# Patient Record
Sex: Female | Born: 1985
Health system: Southern US, Community
[De-identification: ages and names within clinical notes are randomized; demographics above are authoritative.]

## PROBLEM LIST (undated history)

## (undated) DIAGNOSIS — F909 Attention-deficit hyperactivity disorder, unspecified type: Secondary | ICD-10-CM

## (undated) DIAGNOSIS — O24419 Gestational diabetes mellitus in pregnancy, unspecified control: Secondary | ICD-10-CM

## (undated) DIAGNOSIS — Z789 Other specified health status: Secondary | ICD-10-CM

## (undated) DIAGNOSIS — Z8632 Personal history of gestational diabetes: Secondary | ICD-10-CM

## (undated) HISTORY — DX: Gestational diabetes mellitus in pregnancy, unspecified control: O24.419

## (undated) HISTORY — DX: Attention-deficit hyperactivity disorder, unspecified type: F90.9

## (undated) HISTORY — PX: NO PAST SURGERIES: SHX2092

## (undated) HISTORY — DX: Personal history of gestational diabetes: Z86.32

---
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
## 2018-02-01 DIAGNOSIS — F900 Attention-deficit hyperactivity disorder, predominantly inattentive type: Secondary | ICD-10-CM | POA: Diagnosis not present

## 2018-03-01 DIAGNOSIS — F900 Attention-deficit hyperactivity disorder, predominantly inattentive type: Secondary | ICD-10-CM | POA: Diagnosis not present

## 2018-03-09 MED FILL — DEXTROAMP-AMPHETAMIN 20 MG: 20 | 30 days supply | Qty: 30 | Fill #0

## 2018-03-31 DIAGNOSIS — F900 Attention-deficit hyperactivity disorder, predominantly inattentive type: Secondary | ICD-10-CM | POA: Diagnosis not present

## 2018-04-06 MED FILL — DEXTROAMP-AMPHETAMIN 20 MG: 20 | 30 days supply | Qty: 60 | Fill #0

## 2018-05-06 MED FILL — DEXTROAMP-AMPHETAMIN 20 MG: 20 | 30 days supply | Qty: 60 | Fill #0

## 2018-06-04 MED FILL — AMPHETAMINE-DEXTROAMPHETAMI: 20 | 30 days supply | Qty: 60 | Fill #0

## 2018-06-28 DIAGNOSIS — F900 Attention-deficit hyperactivity disorder, predominantly inattentive type: Secondary | ICD-10-CM | POA: Diagnosis not present

## 2018-07-06 MED FILL — AMPHETAMINE-DEXTROAMPHETAMI: 20 | 30 days supply | Qty: 60 | Fill #0

## 2018-08-03 MED FILL — AMPHETAMINE-DEXTROAMPHETAMI: 20 | 30 days supply | Qty: 60 | Fill #0

## 2018-09-01 MED FILL — predniSONE 10 MG TABS: 10 | 12 days supply | Qty: 48 | Fill #0

## 2018-09-01 MED FILL — AMPHETAMINE-DEXTROAMPHETAMI: 20 | 30 days supply | Qty: 60 | Fill #0

## 2018-09-30 MED FILL — AMPHETAMINE-DEXTROAMPHETAMI: 20 | 30 days supply | Qty: 60 | Fill #0

## 2018-10-12 DIAGNOSIS — F900 Attention-deficit hyperactivity disorder, predominantly inattentive type: Secondary | ICD-10-CM | POA: Diagnosis not present

## 2018-11-02 MED FILL — AMPHETAMINE-DEXTROAMPHETAMI: 20 | 30 days supply | Qty: 60 | Fill #0

## 2018-12-13 MED FILL — AMPHETAMINE-DEXTROAMPHETAMI: 20 | 30 days supply | Qty: 60 | Fill #0

## 2019-01-10 DIAGNOSIS — F900 Attention-deficit hyperactivity disorder, predominantly inattentive type: Secondary | ICD-10-CM | POA: Diagnosis not present

## 2019-01-11 MED FILL — ADDERALL XR 30 MG CAP SA: 30 | 30 days supply | Qty: 30 | Fill #0

## 2019-01-11 MED FILL — AMPHETAMINE-DEXTROAMPHETAMI: 20 | 30 days supply | Qty: 30 | Fill #0

## 2019-02-21 DIAGNOSIS — N390 Urinary tract infection, site not specified: Secondary | ICD-10-CM | POA: Diagnosis not present

## 2019-02-21 DIAGNOSIS — Z3201 Encounter for pregnancy test, result positive: Secondary | ICD-10-CM | POA: Diagnosis not present

## 2019-02-22 MED FILL — AMPHETAMINE-DEXTROAMPHETAMI: 20 | 30 days supply | Qty: 30 | Fill #0

## 2019-02-22 MED FILL — ADDERALL XR 30 MG CAP SA: 30 | 30 days supply | Qty: 30 | Fill #0

## 2019-04-05 MED FILL — ADDERALL XR 30 MG CAP SA: 30 | 30 days supply | Qty: 30 | Fill #0

## 2019-04-05 MED FILL — AMPHETAMINE-DEXTROAMPHETAMI: 20 | 30 days supply | Qty: 30 | Fill #0

## 2019-04-06 ENCOUNTER — Other Ambulatory Visit: Payer: Self-pay

## 2019-04-06 ENCOUNTER — Encounter: Payer: Self-pay | Admitting: Obstetrics & Gynecology

## 2019-04-06 ENCOUNTER — Ambulatory Visit (INDEPENDENT_AMBULATORY_CARE_PROVIDER_SITE_OTHER): Payer: 59 | Admitting: Obstetrics & Gynecology

## 2019-04-06 DIAGNOSIS — Z113 Encounter for screening for infections with a predominantly sexual mode of transmission: Secondary | ICD-10-CM | POA: Diagnosis not present

## 2019-04-06 DIAGNOSIS — Z124 Encounter for screening for malignant neoplasm of cervix: Secondary | ICD-10-CM

## 2019-04-06 DIAGNOSIS — Z1151 Encounter for screening for human papillomavirus (HPV): Secondary | ICD-10-CM | POA: Diagnosis not present

## 2019-04-06 DIAGNOSIS — O0993 Supervision of high risk pregnancy, unspecified, third trimester: Secondary | ICD-10-CM | POA: Insufficient documentation

## 2019-04-06 DIAGNOSIS — Z3480 Encounter for supervision of other normal pregnancy, unspecified trimester: Secondary | ICD-10-CM | POA: Insufficient documentation

## 2019-04-06 DIAGNOSIS — Z3481 Encounter for supervision of other normal pregnancy, first trimester: Secondary | ICD-10-CM

## 2019-04-06 DIAGNOSIS — Z3A11 11 weeks gestation of pregnancy: Secondary | ICD-10-CM

## 2019-04-06 NOTE — Progress Notes (Signed)
DATING AND VIABILITY SONOGRAM   Brandi Russo is a 33 y.o. year old G2P1001 with LMP Patient's last menstrual period was 01/19/2019 (exact date). which would correlate to  [redacted]w[redacted]d weeks gestation.  She has regular menstrual cycles.   She is here today for a confirmatory initial sonogram.    GESTATION: SINGLETON  FETAL ACTIVITY:          Heart rate         166          The fetus is active.    ADNEXA: The ovaries are normal.   GESTATIONAL AGE AND  BIOMETRICS:  Gestational criteria: Estimated Date of Delivery: 10/26/19 by LMP now at [redacted]w[redacted]d  Previous Scans:0           CROWN RUMP LENGTH           3.87 cm        10.5 weeks                                                                               AVERAGE EGA(BY THIS SCAN):  10.5 weeks  WORKING EDD( LMP ):  10-26-2019     TECHNICIAN COMMENTS: Patient informed that the ultrasound is considered a limited obstetric ultrasound and is not intended to be a complete ultrasound exam. Patient also informed that the ultrasound is not being completed with the intent of assessing for fetal or placental anomalies or any pelvic abnormalities. Explained that the purpose of today's ultrasound is to assess for fetal heart rate. Patient acknowledges the purpose of the exam and the limitations of the study.   Kathrene Alu 04/06/2019 4:39 PM

## 2019-04-06 NOTE — Patient Instructions (Signed)
First Trimester of Pregnancy The first trimester of pregnancy is from week 1 until the end of week 13 (months 1 through 3). A week after a sperm fertilizes an egg, the egg will implant on the wall of the uterus. This embryo will begin to develop into a baby. Genes from you and your partner will form the baby. The female genes will determine whether the baby will be a boy or a girl. At 6-8 weeks, the eyes and face will be formed, and the heartbeat can be seen on ultrasound. At the end of 12 weeks, all the baby's organs will be formed. Now that you are pregnant, you will want to do everything you can to have a healthy baby. Two of the most important things are to get good prenatal care and to follow your health care provider's instructions. Prenatal care is all the medical care you receive before the baby's birth. This care will help prevent, find, and treat any problems during the pregnancy and childbirth. Body changes during your first trimester Your body goes through many changes during pregnancy. The changes vary from woman to woman.  You may gain or lose a couple of pounds at first.  You may feel sick to your stomach (nauseous) and you may throw up (vomit). If the vomiting is uncontrollable, call your health care provider.  You may tire easily.  You may develop headaches that can be relieved by medicines. All medicines should be approved by your health care provider.  You may urinate more often. Painful urination may mean you have a bladder infection.  You may develop heartburn as a result of your pregnancy.  You may develop constipation because certain hormones are causing the muscles that push stool through your intestines to slow down.  You may develop hemorrhoids or swollen veins (varicose veins).  Your breasts may begin to grow larger and become tender. Your nipples may stick out more, and the tissue that surrounds them (areola) may become darker.  Your gums may bleed and may be  sensitive to brushing and flossing.  Dark spots or blotches (chloasma, mask of pregnancy) may develop on your face. This will likely fade after the baby is born.  Your menstrual periods will stop.  You may have a loss of appetite.  You may develop cravings for certain kinds of food.  You may have changes in your emotions from day to day, such as being excited to be pregnant or being concerned that something may go wrong with the pregnancy and baby.  You may have more vivid and strange dreams.  You may have changes in your hair. These can include thickening of your hair, rapid growth, and changes in texture. Some women also have hair loss during or after pregnancy, or hair that feels dry or thin. Your hair will most likely return to normal after your baby is born. What to expect at prenatal visits During a routine prenatal visit:  You will be weighed to make sure you and the baby are growing normally.  Your blood pressure will be taken.  Your abdomen will be measured to track your baby's growth.  The fetal heartbeat will be listened to between weeks 10 and 14 of your pregnancy.  Test results from any previous visits will be discussed. Your health care provider may ask you:  How you are feeling.  If you are feeling the baby move.  If you have had any abnormal symptoms, such as leaking fluid, bleeding, severe headaches, or abdominal   cramping.  If you are using any tobacco products, including cigarettes, chewing tobacco, and electronic cigarettes.  If you have any questions. Other tests that may be performed during your first trimester include:  Blood tests to find your blood type and to check for the presence of any previous infections. The tests will also be used to check for low iron levels (anemia) and protein on red blood cells (Rh antibodies). Depending on your risk factors, or if you previously had diabetes during pregnancy, you may have tests to check for high blood sugar  that affects pregnant women (gestational diabetes).  Urine tests to check for infections, diabetes, or protein in the urine.  An ultrasound to confirm the proper growth and development of the baby.  Fetal screens for spinal cord problems (spina bifida) and Down syndrome.  HIV (human immunodeficiency virus) testing. Routine prenatal testing includes screening for HIV, unless you choose not to have this test.  You may need other tests to make sure you and the baby are doing well. Follow these instructions at home: Medicines  Follow your health care provider's instructions regarding medicine use. Specific medicines may be either safe or unsafe to take during pregnancy.  Take a prenatal vitamin that contains at least 600 micrograms (mcg) of folic acid.  If you develop constipation, try taking a stool softener if your health care provider approves. Eating and drinking   Eat a balanced diet that includes fresh fruits and vegetables, whole grains, good sources of protein such as meat, eggs, or tofu, and low-fat dairy. Your health care provider will help you determine the amount of weight gain that is right for you.  Avoid raw meat and uncooked cheese. These carry germs that can cause birth defects in the baby.  Eating four or five small meals rather than three large meals a day may help relieve nausea and vomiting. If you start to feel nauseous, eating a few soda crackers can be helpful. Drinking liquids between meals, instead of during meals, also seems to help ease nausea and vomiting.  Limit foods that are high in fat and processed sugars, such as fried and sweet foods.  To prevent constipation: ? Eat foods that are high in fiber, such as fresh fruits and vegetables, whole grains, and beans. ? Drink enough fluid to keep your urine clear or pale yellow. Activity  Exercise only as directed by your health care provider. Most women can continue their usual exercise routine during  pregnancy. Try to exercise for 30 minutes at least 5 days a week. Exercising will help you: ? Control your weight. ? Stay in shape. ? Be prepared for labor and delivery.  Experiencing pain or cramping in the lower abdomen or lower back is a good sign that you should stop exercising. Check with your health care provider before continuing with normal exercises.  Try to avoid standing for long periods of time. Move your legs often if you must stand in one place for a long time.  Avoid heavy lifting.  Wear low-heeled shoes and practice good posture.  You may continue to have sex unless your health care provider tells you not to. Relieving pain and discomfort  Wear a good support bra to relieve breast tenderness.  Take warm sitz baths to soothe any pain or discomfort caused by hemorrhoids. Use hemorrhoid cream if your health care provider approves.  Rest with your legs elevated if you have leg cramps or low back pain.  If you develop varicose veins in   your legs, wear support hose. Elevate your feet for 15 minutes, 3-4 times a day. Limit salt in your diet. Prenatal care  Schedule your prenatal visits by the twelfth week of pregnancy. They are usually scheduled monthly at first, then more often in the last 2 months before delivery.  Write down your questions. Take them to your prenatal visits.  Keep all your prenatal visits as told by your health care provider. This is important. Safety  Wear your seat belt at all times when driving.  Make a list of emergency phone numbers, including numbers for family, friends, the hospital, and police and fire departments. General instructions  Ask your health care provider for a referral to a local prenatal education class. Begin classes no later than the beginning of month 6 of your pregnancy.  Ask for help if you have counseling or nutritional needs during pregnancy. Your health care provider can offer advice or refer you to specialists for help  with various needs.  Do not use hot tubs, steam rooms, or saunas.  Do not douche or use tampons or scented sanitary pads.  Do not cross your legs for long periods of time.  Avoid cat litter boxes and soil used by cats. These carry germs that can cause birth defects in the baby and possibly loss of the fetus by miscarriage or stillbirth.  Avoid all smoking, herbs, alcohol, and medicines not prescribed by your health care provider. Chemicals in these products affect the formation and growth of the baby.  Do not use any products that contain nicotine or tobacco, such as cigarettes and e-cigarettes. If you need help quitting, ask your health care provider. You may receive counseling support and other resources to help you quit.  Schedule a dentist appointment. At home, brush your teeth with a soft toothbrush and be gentle when you floss. Contact a health care provider if:  You have dizziness.  You have mild pelvic cramps, pelvic pressure, or nagging pain in the abdominal area.  You have persistent nausea, vomiting, or diarrhea.  You have a bad smelling vaginal discharge.  You have pain when you urinate.  You notice increased swelling in your face, hands, legs, or ankles.  You are exposed to fifth disease or chickenpox.  You are exposed to German measles (rubella) and have never had it. Get help right away if:  You have a fever.  You are leaking fluid from your vagina.  You have spotting or bleeding from your vagina.  You have severe abdominal cramping or pain.  You have rapid weight gain or loss.  You vomit blood or material that looks like coffee grounds.  You develop a severe headache.  You have shortness of breath.  You have any kind of trauma, such as from a fall or a car accident. Summary  The first trimester of pregnancy is from week 1 until the end of week 13 (months 1 through 3).  Your body goes through many changes during pregnancy. The changes vary from  woman to woman.  You will have routine prenatal visits. During those visits, your health care provider will examine you, discuss any test results you may have, and talk with you about how you are feeling. This information is not intended to replace advice given to you by your health care provider. Make sure you discuss any questions you have with your health care provider. Document Released: 04/15/2001 Document Revised: 04/03/2017 Document Reviewed: 04/02/2016 Elsevier Patient Education  2020 Elsevier Inc.  

## 2019-04-06 NOTE — Progress Notes (Signed)
  Subjective:    Brandi Russo is being seen today for her first obstetrical visit. G2P1001 LMP 01/19/2019.  This is a planned pregnancy. She is at [redacted]w[redacted]d gestation. Her obstetrical history is significant for no issues. . Relationship with FOB: spouse, living together. Patient does intend to breast feed. Pregnancy history fully reviewed. Pt is a nurse at [employer]  Patient reports no complaints.  Review of Systems:   Review of Systems Sigel  Objective:     BP 124/75   Pulse 87   Ht 5\' 4"  (1.626 m)   Wt 209 lb 0.6 oz (94.8 kg)   LMP 01/19/2019 (Exact Date)   BMI 35.88 kg/m  Physical Exam  Exam General Appearance:    Alert, cooperative, no distress, appears stated age  Head:    Normocephalic, without obvious abnormality, atraumatic  Eyes:    conjunctiva/corneas clear, EOM's intact, both eyes  Ears:    Normal external ear canals, both ears  Nose:   Nares normal, septum midline, mucosa normal, no drainage    or sinus tenderness  Throat:   Lips, mucosa, and tongue normal; teeth and gums normal  Neck:   Supple, symmetrical, trachea midline, no adenopathy;    thyroid:  no enlargement/tenderness/nodules  Back:     Symmetric, no curvature, ROM normal, no CVA tenderness  Lungs:     respirations unlabored  Chest Wall:    No tenderness or deformity   Heart:    Regular rate and rhythm  Breast Exam:    No tenderness, masses, or nipple abnormality  Abdomen:     Soft, non-tender, bowel sounds active all four quadrants,    no masses, no organomegaly  Genitalia:    Normal female without lesion, discharge or tenderness   12 weeks sized uters  Extremities:   Extremities normal, atraumatic, no cyanosis or edema  Pulses:   2+ and symmetric all extremities  Skin:   Skin color, texture, turgor normal, no rashes or lesions     Assessment:    Pregnancy: G2P1001 Patient Active Problem List   Diagnosis Date Noted  . Supervision of other normal pregnancy, antepartum 04/06/2019       Plan:      Initial labs drawn. Prenatal vitamins. Problem list reviewed and updated. AFP3 discussed: declined. Role of ultrasound in pregnancy discussed; fetal survey: requested. Amniocentesis discussed: not indicated. Follow up in 5 weeks. 60% of 70min visit spent on counseling and coordination of care.    [name] 04/06/2019

## 2019-04-09 LAB — URINE CULTURE, OB REFLEX

## 2019-04-09 LAB — CULTURE, OB URINE

## 2019-04-11 DIAGNOSIS — F900 Attention-deficit hyperactivity disorder, predominantly inattentive type: Secondary | ICD-10-CM | POA: Diagnosis not present

## 2019-04-12 LAB — HEMOGLOBINOPATHY EVALUATION
Ferritin: 67 ng/mL (ref 15–150)
Hgb A2 Quant: 2.4 % (ref 1.8–3.2)
Hgb A: 96.6 % (ref 96.4–98.8)
Hgb C: 0 %
Hgb F Quant: 1 % (ref 0.0–2.0)
Hgb S: 0 %
Hgb Solubility: NEGATIVE
Hgb Variant: 0 %

## 2019-04-12 LAB — OBSTETRIC PANEL, INCLUDING HIV
Antibody Screen: NEGATIVE
Basophils Absolute: 0 10*3/uL (ref 0.0–0.2)
Basos: 0 %
EOS (ABSOLUTE): 0.2 10*3/uL (ref 0.0–0.4)
Eos: 2 %
HIV Screen 4th Generation wRfx: NONREACTIVE
Hematocrit: 38.9 % (ref 34.0–46.6)
Hemoglobin: 13.4 g/dL (ref 11.1–15.9)
Hepatitis B Surface Ag: NEGATIVE
Immature Grans (Abs): 0 10*3/uL (ref 0.0–0.1)
Immature Granulocytes: 0 %
Lymphocytes Absolute: 2.6 10*3/uL (ref 0.7–3.1)
Lymphs: 27 %
MCH: 29.8 pg (ref 26.6–33.0)
MCHC: 34.4 g/dL (ref 31.5–35.7)
MCV: 87 fL (ref 79–97)
Monocytes Absolute: 0.6 10*3/uL (ref 0.1–0.9)
Monocytes: 7 %
Neutrophils Absolute: 6.1 10*3/uL (ref 1.4–7.0)
Neutrophils: 64 %
Platelets: 273 10*3/uL (ref 150–450)
RBC: 4.49 x10E6/uL (ref 3.77–5.28)
RDW: 13 % (ref 11.7–15.4)
RPR Ser Ql: NONREACTIVE
Rh Factor: POSITIVE
Rubella Antibodies, IGG: 6.07 index (ref >0.99)
WBC: 9.6 10*3/uL (ref 3.4–10.8)

## 2019-04-12 LAB — CYSTIC FIBROSIS GENE TEST

## 2019-04-12 LAB — HEMOGLOBIN A1C
Est. average glucose Bld gHb Est-mCnc: 128 mg/dL
Hgb A1c MFr Bld: 6.1 % — ABNORMAL HIGH (ref 4.8–5.6)

## 2019-04-14 ENCOUNTER — Encounter: Payer: Self-pay | Admitting: Obstetrics & Gynecology

## 2019-04-14 DIAGNOSIS — R7303 Prediabetes: Secondary | ICD-10-CM | POA: Insufficient documentation

## 2019-04-15 ENCOUNTER — Other Ambulatory Visit: Payer: Self-pay | Admitting: Obstetrics & Gynecology

## 2019-04-15 LAB — CYTOLOGY - PAP
Chlamydia: NEGATIVE
Comment: NEGATIVE
Comment: NEGATIVE
Comment: NORMAL
Diagnosis: NEGATIVE
High risk HPV: NEGATIVE
Neisseria Gonorrhea: NEGATIVE

## 2019-04-15 MED ORDER — BUTALBITAL-APAP-CAFFEINE 50-325-40 MG PO CAPS: 1.0000 | ORAL_CAPSULE | Freq: Four times a day (QID) | ORAL | 3 refills | Status: DC | PRN

## 2019-04-15 MED ORDER — BUTALBITAL-APAP-CAFFEINE 50-325-40 MG PO CAPS: 1.0000 | ORAL_CAPSULE | ORAL | 0 refills | Status: DC | PRN

## 2019-04-15 NOTE — Progress Notes (Signed)
Pt having headache and tylenol not working.  Recently diagnosed with covid.  Pt is [redacted] weeks pregnant seen at [facility].  Pt requesting fioricet.  Also wants to take OTC Mucinex.  Fioricet e-prescribed to [facility].

## 2019-04-19 LAB — SMN1 COPY NUMBER ANALYSIS (SMA CARRIER SCREENING)

## 2019-04-20 ENCOUNTER — Telehealth: Payer: Self-pay

## 2019-04-20 DIAGNOSIS — O99810 Abnormal glucose complicating pregnancy: Secondary | ICD-10-CM

## 2019-04-20 MED ORDER — NITROFURANTOIN MONOHYD MACRO 100 MG PO CAPS: 100.0000 mg | ORAL_CAPSULE | Freq: Two times a day (BID) | ORAL | 0 refills | Status: DC

## 2019-04-20 MED FILL — NITROFURANTOIN MONO-MCR 100: 100 | 7 days supply | Qty: 14 | Fill #0

## 2019-04-20 NOTE — Telephone Encounter (Signed)
Patient called and checked how she was doing since covid dx- patient states she is on day 10 and is feeling a little better. Still having some cough and fatigue are her biggest complaints.   Patient called and made aware of elevated hgba1c and need for diabetes management. Patient states she was gestational diabetic with her first pregnancy. Made her aware we will send in referral to diabetes management and they will contact her in regards to coming to class.  Patient also made aware of UTI and need for treatment. Patient states she actually had one at the end of Nov that was treated with Keflex. Patient made aware that we will try Macrobid since she is out of her first trimester and the urine culture revealed sensitivity to macrobid. Patient states understanding and verified pharmacy. Kathrene Alu RN

## 2019-05-04 ENCOUNTER — Encounter: Payer: 59 | Attending: Obstetrics & Gynecology | Admitting: Registered"

## 2019-05-04 ENCOUNTER — Encounter: Payer: Self-pay | Admitting: Registered"

## 2019-05-04 ENCOUNTER — Other Ambulatory Visit: Payer: Self-pay

## 2019-05-04 DIAGNOSIS — O99810 Abnormal glucose complicating pregnancy: Secondary | ICD-10-CM | POA: Insufficient documentation

## 2019-05-04 NOTE — Progress Notes (Signed)
Patient was seen on 05/04/2019 for Gestational Diabetes self-management class at [facility]. The following learning objectives were met by the patient during this course:   States the definition of Gestational Diabetes  States why dietary management is important in controlling blood glucose  Describes the effects each nutrient has on blood glucose levels  Demonstrates ability to create a balanced meal plan  Demonstrates carbohydrate counting   States when to check blood glucose levels  Demonstrates proper blood glucose monitoring techniques  States the effect of stress and exercise on blood glucose levels  States the importance of limiting caffeine and abstaining from alcohol and smoking  Blood glucose monitor given: none ([employer] Employee) Demonstrated technique with sample Freestyle   Patient instructed to monitor glucose levels: FBS: 60 - <95; 1 hour: <140; 2 hour: <120  Patient received handouts:  Nutrition Diabetes and Pregnancy, including carb counting list  Patient will be seen for follow-up as needed.

## 2019-05-06 NOTE — L&D Delivery Note (Addendum)
[facility]N [facility] Delivery Note  Brandi Russo is a 34 y.o. [id number] s/p SVD at [redacted]w[redacted]d. She was admitted for SROM/SOL.   ROM: 3h 72m with clear fluid GBS Status: Negative Maximum Maternal Temperature: 98.1  Labor Progress: . Presented after SROM and found to be 5 cm dilated and in SOL.  Quickly progressed to complete without complication/intervention.  Delivery Date/Time: 10/18/2019 at 1202 Delivery: Called to room and patient was complete and pushing. Head delivered LOA. No nuchal cord present. Anterior shoulder did not immediately deliver, a shoulder dystocia was identified.  She was placed in McRobert's and then suprapubic pressure was applied without success. Dr. Crissie Reese attempted to deliver the posterior arm without success.  Dr. Adrian Blackwater then attempted and was able to deliver anterior should with suprapubic pressure. Total time for shoulder dystocia was 1.5 minutes. Body delivered in usual fashion. Cord immediately clamped x 2 and cut. Infant immediately handed off to NICU team and taken to the warmer. Arterial cord gas and cord blood drawn. Placenta delivered spontaneously with gentle cord traction. Fundus firm with massage and Pitocin. Labia, perineum, vagina, and cervix were inspected, and found to be intact.   Placenta: Intact, 3 vessel cord Complications: 1.5 minute shoulder dystocia Lacerations: None EBL: 200 cc Analgesia: None   Infant: Viable female  APGARs 4/9  Weight 3785 grams   [name], MD PGY-2 Resident, Family Medicine 10/18/2019, 12:21 PM   OB FELLOW ATTESTATION  I was present, gloved, and supervising throughout delivery and have edited the above note to reflect any changes or updates.  Zack Seal, MD/[facility] OB Fellow  10/18/2019, 1:39 PM

## 2019-05-09 ENCOUNTER — Telehealth: Payer: Self-pay

## 2019-05-09 MED ORDER — FREESTYLE LANCETS MISC: 12 refills | Status: DC

## 2019-05-09 MED ORDER — GLUCOSE BLOOD VI STRP: ORAL_STRIP | 12 refills | Status: DC

## 2019-05-09 MED ORDER — BLOOD GLUCOSE METER KIT: PACK | 0 refills | Status: DC

## 2019-05-09 MED FILL — FREESTYLE LITE TEST STRIP: 90 days supply | Qty: 400 | Fill #0

## 2019-05-09 MED FILL — FREESTYLE LANCETS: 90 days supply | Qty: 400 | Fill #0

## 2019-05-09 MED FILL — FREESTYLE LITE METER: 30 days supply | Qty: 1 | Fill #0

## 2019-05-09 NOTE — Telephone Encounter (Signed)
Patient called stating that she went to diabetes education and they didn't have a meter for her. Patient is a [employer] employee and states they told her she could use an [employer] freestyle or precision meter. Armandina Stammer RN

## 2019-05-10 ENCOUNTER — Encounter: Payer: Self-pay | Admitting: Advanced Practice Midwife

## 2019-05-10 ENCOUNTER — Other Ambulatory Visit: Payer: Self-pay

## 2019-05-10 ENCOUNTER — Ambulatory Visit (INDEPENDENT_AMBULATORY_CARE_PROVIDER_SITE_OTHER): Payer: 59 | Admitting: Advanced Practice Midwife

## 2019-05-10 VITALS — BP 128/83 | HR 88 | Wt 209.0 lb

## 2019-05-10 DIAGNOSIS — Z3480 Encounter for supervision of other normal pregnancy, unspecified trimester: Secondary | ICD-10-CM | POA: Diagnosis not present

## 2019-05-10 DIAGNOSIS — Z3A15 15 weeks gestation of pregnancy: Secondary | ICD-10-CM

## 2019-05-10 DIAGNOSIS — O24415 Gestational diabetes mellitus in pregnancy, controlled by oral hypoglycemic drugs: Secondary | ICD-10-CM | POA: Insufficient documentation

## 2019-05-10 DIAGNOSIS — O24419 Gestational diabetes mellitus in pregnancy, unspecified control: Secondary | ICD-10-CM | POA: Insufficient documentation

## 2019-05-10 DIAGNOSIS — G5600 Carpal tunnel syndrome, unspecified upper limb: Secondary | ICD-10-CM

## 2019-05-10 DIAGNOSIS — O24410 Gestational diabetes mellitus in pregnancy, diet controlled: Secondary | ICD-10-CM

## 2019-05-10 DIAGNOSIS — Z3482 Encounter for supervision of other normal pregnancy, second trimester: Secondary | ICD-10-CM | POA: Diagnosis not present

## 2019-05-10 DIAGNOSIS — O98512 Other viral diseases complicating pregnancy, second trimester: Secondary | ICD-10-CM

## 2019-05-10 DIAGNOSIS — U071 COVID-19: Secondary | ICD-10-CM | POA: Insufficient documentation

## 2019-05-10 MED FILL — AMPHETAMINE-DEXTROAMPHETAMI: 20 | 30 days supply | Qty: 30 | Fill #0

## 2019-05-10 MED FILL — ADDERALL XR 30 MG CAP SA: 30 | 30 days supply | Qty: 30 | Fill #0

## 2019-05-10 NOTE — Progress Notes (Signed)
   PRENATAL VISIT NOTE  Subjective:  Brandi Russo is a 34 y.o. G2P1001 at [redacted]w[redacted]d being seen today for ongoing prenatal care.  She is currently monitored for the following issues for this high-risk pregnancy and has Supervision of other normal pregnancy, antepartum; Prediabetes; COVID-19 affecting pregnancy in second trimester; and Gestational diabetes on their problem list.  Patient reports some persistent fatigue.  Carpal tunnel persists despite wrist splints.  Contractions: Not present. Vag. Bleeding: None.  Movement: Absent. Denies leaking of fluid.   The following portions of the patient's history were reviewed and updated as appropriate: allergies, current medications, past family history, past medical history, past social history, past surgical history and problem list.   Objective:   Vitals:   05/10/19 0820  BP: 128/83  Pulse: 88  Weight: 209 lb (94.8 kg)    Fetal Status: Fetal Heart Rate (bpm): 150   Movement: Absent     General:  Alert, oriented and cooperative. Patient is in no acute distress.  Skin: Skin is warm and dry. No rash noted.   Cardiovascular: Normal heart rate noted  Respiratory: Normal respiratory effort, no problems with respiration noted  Abdomen: Soft, gravid, appropriate for gestational age.  Pain/Pressure: Absent     Pelvic: Cervical exam deferred        Extremities: Normal range of motion.  Edema: None  Mental Status: Normal mood and affect. Normal behavior. Normal judgment and thought content.   Assessment and Plan:  Pregnancy: G2P1001 at [redacted]w[redacted]d 1. COVID-19 affecting pregnancy in second trimester     Persistent fatigue, no respiratory problems  2. Supervision of other normal pregnancy, antepartum      - Panorama or Harmony - AFP, Quad Screen (15.0-22.6)*LC  3. Diet controlled gestational diabetes mellitus (GDM) in second trimester     Getting meter today at pharmacy  4.   Carpal Tunnel     Refer to Hand specialist      Wearing splints with no  relief  Preterm labor symptoms and general obstetric precautions including but not limited to vaginal bleeding, contractions, leaking of fluid and fetal movement were reviewed in detail with the patient. Please refer to After Visit Summary for other counseling recommendations.   Return in about 4 weeks (around 06/07/2019) for TELEHEALTH VISIT.  Future Appointments  Date Time Provider Department Center  06/02/2019  1:15 PM [facility] [place] 4 [facility]  06/07/2019  8:15 AM Aviva Signs, CNM [facility] None    Wynelle Bourgeois, CNM

## 2019-05-10 NOTE — Patient Instructions (Signed)

## 2019-05-12 LAB — AFP TETRA
DIA Mom Value: 0.67
DIA Value (EIA): 94.5 pg/mL
DSR (By Age)    1 IN: 553
DSR (Second Trimester) 1 IN: 9552
Gestational Age: 15.6 WEEKS
MSAFP Mom: 0.73
MSAFP: 18.8 ng/mL
MSHCG Mom: 0.78
MSHCG: 29100 m[IU]/mL
Maternal Age At EDD: 31.7 yr
Osb Risk: 10000
T18 (By Age): 1:2153 {titer}
Test Results:: NEGATIVE
Weight: 209 [lb_av]
uE3 Mom: 1.27
uE3 Value: 1.02 ng/mL

## 2019-05-18 NOTE — Progress Notes (Signed)
Patient made aware of results- low risk. Patient asked that results be placed in envelope for pick up and to find out the gender that way. Armandina Stammer RN

## 2019-06-01 DIAGNOSIS — G5603 Carpal tunnel syndrome, bilateral upper limbs: Secondary | ICD-10-CM | POA: Diagnosis not present

## 2019-06-01 DIAGNOSIS — M79642 Pain in left hand: Secondary | ICD-10-CM | POA: Diagnosis not present

## 2019-06-01 DIAGNOSIS — M79641 Pain in right hand: Secondary | ICD-10-CM | POA: Diagnosis not present

## 2019-06-02 ENCOUNTER — Other Ambulatory Visit: Payer: Self-pay

## 2019-06-02 ENCOUNTER — Ambulatory Visit (HOSPITAL_COMMUNITY)
Admission: RE | Admit: 2019-06-02 | Discharge: 2019-06-02 | Disposition: A | Discharge status: Home / Self Care | Payer: 59 | Source: Ambulatory Visit | Attending: Obstetrics & Gynecology | Admitting: Obstetrics & Gynecology

## 2019-06-02 ENCOUNTER — Other Ambulatory Visit: Payer: Self-pay | Admitting: Obstetrics & Gynecology

## 2019-06-02 DIAGNOSIS — Z3480 Encounter for supervision of other normal pregnancy, unspecified trimester: Secondary | ICD-10-CM | POA: Diagnosis not present

## 2019-06-02 DIAGNOSIS — O24410 Gestational diabetes mellitus in pregnancy, diet controlled: Secondary | ICD-10-CM

## 2019-06-02 DIAGNOSIS — Z3A19 19 weeks gestation of pregnancy: Secondary | ICD-10-CM | POA: Diagnosis not present

## 2019-06-02 DIAGNOSIS — G5603 Carpal tunnel syndrome, bilateral upper limbs: Secondary | ICD-10-CM | POA: Insufficient documentation

## 2019-06-02 DIAGNOSIS — R7303 Prediabetes: Secondary | ICD-10-CM

## 2019-06-03 ENCOUNTER — Other Ambulatory Visit (HOSPITAL_COMMUNITY): Payer: Self-pay | Admitting: *Deleted

## 2019-06-03 DIAGNOSIS — O24410 Gestational diabetes mellitus in pregnancy, diet controlled: Secondary | ICD-10-CM

## 2019-06-07 ENCOUNTER — Encounter: Payer: Self-pay | Admitting: Advanced Practice Midwife

## 2019-06-07 ENCOUNTER — Ambulatory Visit (INDEPENDENT_AMBULATORY_CARE_PROVIDER_SITE_OTHER): Payer: 59 | Admitting: Advanced Practice Midwife

## 2019-06-07 ENCOUNTER — Other Ambulatory Visit: Payer: Self-pay

## 2019-06-07 VITALS — BP 130/80 | HR 78 | Wt 208.0 lb

## 2019-06-07 DIAGNOSIS — O24419 Gestational diabetes mellitus in pregnancy, unspecified control: Secondary | ICD-10-CM

## 2019-06-07 DIAGNOSIS — Z3480 Encounter for supervision of other normal pregnancy, unspecified trimester: Secondary | ICD-10-CM

## 2019-06-07 DIAGNOSIS — O24410 Gestational diabetes mellitus in pregnancy, diet controlled: Secondary | ICD-10-CM

## 2019-06-07 DIAGNOSIS — Z3A19 19 weeks gestation of pregnancy: Secondary | ICD-10-CM

## 2019-06-07 DIAGNOSIS — K219 Gastro-esophageal reflux disease without esophagitis: Secondary | ICD-10-CM

## 2019-06-07 MED ORDER — PANTOPRAZOLE SODIUM 20 MG PO TBEC: 20.0000 mg | DELAYED_RELEASE_TABLET | Freq: Every day | ORAL | 1 refills | Status: DC

## 2019-06-07 MED FILL — PANTOPRAZOLE SOD DR 20 MG T: 20 | 60 days supply | Qty: 60 | Fill #0

## 2019-06-07 NOTE — Progress Notes (Signed)
   PRENATAL VISIT NOTE  Subjective:  Brandi Russo is a 34 y.o. G2P1001 at [redacted]w[redacted]d being seen today for ongoing prenatal care.  She is currently monitored for the following issues for this high-risk pregnancy and has Supervision of other normal pregnancy, antepartum; Prediabetes; COVID-19 affecting pregnancy in second trimester; and Gestational diabetes on their problem list.  Patient reports acid reflux, not helped by Tums.  Contractions: Not present. Vag. Bleeding: None.  Movement: Present. Denies leaking of fluid.   The following portions of the patient's history were reviewed and updated as appropriate: allergies, current medications, past family history, past medical history, past social history, past surgical history and problem list.   Objective:   Vitals:   06/07/19 0811  BP: 130/80  Pulse: 78  Weight: 208 lb (94.3 kg)    Fetal Status: Fetal Heart Rate (bpm): 144   Movement: Present     General:  Alert, oriented and cooperative. Patient is in no acute distress.  Skin: Skin is warm and dry. No rash noted.   Cardiovascular: Normal heart rate noted  Respiratory: Normal respiratory effort, no problems with respiration noted  Abdomen: Soft, gravid, appropriate for gestational age.  Pain/Pressure: Absent     Pelvic: Cervical exam deferred        Extremities: Normal range of motion.  Edema: None  Mental Status: Normal mood and affect. Normal behavior. Normal judgment and thought content.   Assessment and Plan:  Pregnancy: G2P1001 at [redacted]w[redacted]d 1. Gastroesophageal reflux disease without esophagitis     Discussed options.  Will try Protonix - pantoprazole (PROTONIX) 20 MG tablet; Take 1 tablet (20 mg total) by mouth daily.  Dispense: 30 tablet; Refill: 1  2.   GDM     Doing well on diet. Most values within range.  Occasionally has a 130 PP but usually because she ate off-diet.   Feeling comfortable with diet  3.    Supervision pregnancy     Has followup US scheduled for April for  anatomy  Preterm labor symptoms and general obstetric precautions including but not limited to vaginal bleeding, contractions, leaking of fluid and fetal movement were reviewed in detail with the patient. Please refer to After Visit Summary for other counseling recommendations.   Return in about 4 weeks (around 07/05/2019) for [employer], TELEHEALTH VISIT.  Future Appointments  Date Time Provider Department Center  07/14/2019  3:00 PM Willodean Rosenthal, MD [facility] None  08/04/2019  3:45 PM [facility] NURSE [facility]  08/04/2019  3:45 PM [facility] [place] 2 [facility]    Wynelle Bourgeois, CNM

## 2019-06-07 NOTE — Patient Instructions (Signed)

## 2019-07-05 DIAGNOSIS — O24419 Gestational diabetes mellitus in pregnancy, unspecified control: Secondary | ICD-10-CM | POA: Diagnosis not present

## 2019-07-12 DIAGNOSIS — F900 Attention-deficit hyperactivity disorder, predominantly inattentive type: Secondary | ICD-10-CM | POA: Diagnosis not present

## 2019-07-12 MED FILL — AMPHETAMINE-DEXTROAMPHETAMI: 20 | 30 days supply | Qty: 30 | Fill #0

## 2019-07-12 MED FILL — ADDERALL XR 30 MG CAP SA: 30 | 30 days supply | Qty: 30 | Fill #0

## 2019-07-14 ENCOUNTER — Telehealth (INDEPENDENT_AMBULATORY_CARE_PROVIDER_SITE_OTHER): Payer: 59 | Admitting: Obstetrics & Gynecology

## 2019-07-14 ENCOUNTER — Encounter: Payer: Self-pay | Admitting: Obstetrics & Gynecology

## 2019-07-14 VITALS — Wt 209.0 lb

## 2019-07-14 DIAGNOSIS — O24410 Gestational diabetes mellitus in pregnancy, diet controlled: Secondary | ICD-10-CM

## 2019-07-14 DIAGNOSIS — R7303 Prediabetes: Secondary | ICD-10-CM

## 2019-07-14 DIAGNOSIS — O2240 Hemorrhoids in pregnancy, unspecified trimester: Secondary | ICD-10-CM | POA: Insufficient documentation

## 2019-07-14 DIAGNOSIS — Z3A25 25 weeks gestation of pregnancy: Secondary | ICD-10-CM

## 2019-07-14 DIAGNOSIS — O2242 Hemorrhoids in pregnancy, second trimester: Secondary | ICD-10-CM

## 2019-07-14 DIAGNOSIS — Z3480 Encounter for supervision of other normal pregnancy, unspecified trimester: Secondary | ICD-10-CM

## 2019-07-14 MED ORDER — METFORMIN HCL 500 MG PO TABS: 500.0000 mg | ORAL_TABLET | Freq: Every day | ORAL | 2 refills | Status: DC

## 2019-07-14 MED FILL — metFORMIN HCL 500 MG TABS: 500 | 30 days supply | Qty: 30 | Fill #0

## 2019-07-14 NOTE — Progress Notes (Signed)
   TELEHEALTH OBSTETRICS PRENATAL VIRTUAL VIDEO VISIT ENCOUNTER NOTE  Provider location: Center for [facility]   I connected with Brandi Russo on 07/14/19 at  3:00 PM EST by MyChart Video Encounter at home and verified that I am speaking with the correct person using two identifiers.   I discussed the limitations, risks, security and privacy concerns of performing an evaluation and management service virtually and the availability of in person appointments. I also discussed with the patient that there may be a patient responsible charge related to this service. The patient expressed understanding and agreed to proceed. Subjective:  Brandi Russo is a 34 y.o. G2P1001 at [redacted]w[redacted]d being seen today for ongoing prenatal care.  She is currently monitored for the following issues for this high-risk pregnancy and has Supervision of other normal pregnancy, antepartum; Prediabetes; COVID-19 affecting pregnancy in second trimester; and Gestational diabetes on their problem list.  Patient reports painful hemorrhoids. They first developed during her first pregnancy.  Contractions: Not present. Vag. Bleeding: None.  Movement: Present. Denies any leaking of fluid.   The following portions of the patient's history were reviewed and updated as appropriate: allergies, current medications, past family history, past medical history, past social history, past surgical history and problem list.   Objective:   Vitals:   07/14/19 1459  Weight: 209 lb (94.8 kg)    Fetal Status:     Movement: Present     General:  Alert, oriented and cooperative. Patient is in no acute distress.  Respiratory: Normal respiratory effort, no problems with respiration noted  Mental Status: Normal mood and affect. Normal behavior. Normal judgment and thought content.  Rest of physical exam deferred due to type of encounter  Imaging: No results found.  Assessment and Plan:  Pregnancy: G2P1001 at  [redacted]w[redacted]d 1. Supervision of other normal pregnancy, antepartum Good FM  2. Diet controlled gestational diabetes mellitus (GDM), antepartum Fasting glucose all >95. Most in the low 100's The 2 hour is often around 120s but, last week it was >160s 3x's  Will begin metformin 500mg  qHS Pt will cont to walk will change to daily.         3. Prediabetes  4. Hemorrhoids.  rec hydrocortisone and preparation H after warm compresses.   Preterm labor symptoms and general obstetric precautions including but not limited to vaginal bleeding, contractions, leaking of fluid and fetal movement were reviewed in detail with the patient. I discussed the assessment and treatment plan with the patient. The patient was provided an opportunity to ask questions and all were answered. The patient agreed with the plan and demonstrated an understanding of the instructions. The patient was advised to call back or seek an in-person office evaluation/go to MAU at [facility] for any urgent or concerning symptoms. Please refer to After Visit Summary for other counseling recommendations.   I provided 16 minutes of face-to-face time during this encounter.  Return in about 2 weeks (around 07/28/2019) for in person.  Future Appointments  Date Time Provider Department Center  08/04/2019  3:45 PM WH-MFC NURSE WH-MFC MFC-US  08/04/2019  3:45 PM WH-MFC 10/04/2019 2 WH-MFCUS MFC-US    [place], MD Center for [facility], [facility]

## 2019-08-04 ENCOUNTER — Ambulatory Visit (HOSPITAL_COMMUNITY)
Admission: RE | Admit: 2019-08-04 | Discharge: 2019-08-04 | Disposition: A | Discharge status: Home / Self Care | Payer: 59 | Source: Ambulatory Visit | Attending: Obstetrics and Gynecology | Admitting: Obstetrics and Gynecology

## 2019-08-04 ENCOUNTER — Other Ambulatory Visit: Payer: Self-pay

## 2019-08-04 ENCOUNTER — Encounter (HOSPITAL_COMMUNITY): Payer: Self-pay

## 2019-08-04 ENCOUNTER — Ambulatory Visit (HOSPITAL_COMMUNITY): Payer: 59 | Admitting: *Deleted

## 2019-08-04 DIAGNOSIS — Z362 Encounter for other antenatal screening follow-up: Secondary | ICD-10-CM | POA: Diagnosis not present

## 2019-08-04 DIAGNOSIS — Z3A28 28 weeks gestation of pregnancy: Secondary | ICD-10-CM | POA: Diagnosis not present

## 2019-08-04 DIAGNOSIS — R7303 Prediabetes: Secondary | ICD-10-CM | POA: Diagnosis not present

## 2019-08-04 DIAGNOSIS — O2240 Hemorrhoids in pregnancy, unspecified trimester: Secondary | ICD-10-CM | POA: Diagnosis not present

## 2019-08-04 DIAGNOSIS — O24415 Gestational diabetes mellitus in pregnancy, controlled by oral hypoglycemic drugs: Secondary | ICD-10-CM

## 2019-08-04 DIAGNOSIS — Z3480 Encounter for supervision of other normal pregnancy, unspecified trimester: Secondary | ICD-10-CM | POA: Diagnosis not present

## 2019-08-04 DIAGNOSIS — O24410 Gestational diabetes mellitus in pregnancy, diet controlled: Secondary | ICD-10-CM | POA: Diagnosis not present

## 2019-08-08 ENCOUNTER — Other Ambulatory Visit (HOSPITAL_COMMUNITY): Payer: Self-pay | Admitting: *Deleted

## 2019-08-08 DIAGNOSIS — O24415 Gestational diabetes mellitus in pregnancy, controlled by oral hypoglycemic drugs: Secondary | ICD-10-CM

## 2019-08-09 ENCOUNTER — Ambulatory Visit (INDEPENDENT_AMBULATORY_CARE_PROVIDER_SITE_OTHER): Payer: 59 | Admitting: Advanced Practice Midwife

## 2019-08-09 ENCOUNTER — Other Ambulatory Visit: Payer: Self-pay

## 2019-08-09 ENCOUNTER — Encounter: Payer: Self-pay | Admitting: Advanced Practice Midwife

## 2019-08-09 VITALS — BP 132/78 | HR 89

## 2019-08-09 DIAGNOSIS — O24415 Gestational diabetes mellitus in pregnancy, controlled by oral hypoglycemic drugs: Secondary | ICD-10-CM

## 2019-08-09 DIAGNOSIS — Z3480 Encounter for supervision of other normal pregnancy, unspecified trimester: Secondary | ICD-10-CM | POA: Diagnosis not present

## 2019-08-09 DIAGNOSIS — Z23 Encounter for immunization: Secondary | ICD-10-CM

## 2019-08-09 DIAGNOSIS — Z3A28 28 weeks gestation of pregnancy: Secondary | ICD-10-CM

## 2019-08-09 DIAGNOSIS — O24410 Gestational diabetes mellitus in pregnancy, diet controlled: Secondary | ICD-10-CM

## 2019-08-09 MED FILL — AMPHETAMINE-DEXTROAMPHETAMI: 20 | 30 days supply | Qty: 30 | Fill #0

## 2019-08-09 MED FILL — ADDERALL XR 30 MG CAP SA: 30 | 30 days supply | Qty: 30 | Fill #0

## 2019-08-09 MED FILL — FREESTYLE LITE TEST STRIP: 90 days supply | Qty: 400 | Fill #1

## 2019-08-09 NOTE — Patient Instructions (Signed)
Third Trimester of Pregnancy The third trimester is from week 28 through week 40 (months 7 through 9). The third trimester is a time when the unborn baby (fetus) is growing rapidly. At the end of the ninth month, the fetus is about 20 inches in length and weighs 6-10 pounds. Body changes during your third trimester Your body will continue to go through many changes during pregnancy. The changes vary from woman to woman. During the third trimester:  Your weight will continue to increase. You can expect to gain 25-35 pounds (11-16 kg) by the end of the pregnancy.  You may begin to get stretch marks on your hips, abdomen, and breasts.  You may urinate more often because the fetus is moving lower into your pelvis and pressing on your bladder.  You may develop or continue to have heartburn. This is caused by increased hormones that slow down muscles in the digestive tract.  You may develop or continue to have constipation because increased hormones slow digestion and cause the muscles that push waste through your intestines to relax.  You may develop hemorrhoids. These are swollen veins (varicose veins) in the rectum that can itch or be painful.  You may develop swollen, bulging veins (varicose veins) in your legs.  You may have increased body aches in the pelvis, back, or thighs. This is due to weight gain and increased hormones that are relaxing your joints.  You may have changes in your hair. These can include thickening of your hair, rapid growth, and changes in texture. Some women also have hair loss during or after pregnancy, or hair that feels dry or thin. Your hair will most likely return to normal after your baby is born.  Your breasts will continue to grow and they will continue to become tender. A yellow fluid (colostrum) may leak from your breasts. This is the first milk you are producing for your baby.  Your belly button may stick out.  You may notice more swelling in your hands,  face, or ankles.  You may have increased tingling or numbness in your hands, arms, and legs. The skin on your belly may also feel numb.  You may feel short of breath because of your expanding uterus.  You may have more problems sleeping. This can be caused by the size of your belly, increased need to urinate, and an increase in your body's metabolism.  You may notice the fetus "dropping," or moving lower in your abdomen (lightening).  You may have increased vaginal discharge.  You may notice your joints feel loose and you may have pain around your pelvic bone. What to expect at prenatal visits You will have prenatal exams every 2 weeks until week 36. Then you will have weekly prenatal exams. During a routine prenatal visit:  You will be weighed to make sure you and the baby are growing normally.  Your blood pressure will be taken.  Your abdomen will be measured to track your baby's growth.  The fetal heartbeat will be listened to.  Any test results from the previous visit will be discussed.  You may have a cervical check near your due date to see if your cervix has softened or thinned (effaced).  You will be tested for Group B streptococcus. This happens between 35 and 37 weeks. Your health care provider may ask you:  What your birth plan is.  How you are feeling.  If you are feeling the baby move.  If you have had any abnormal   symptoms, such as leaking fluid, bleeding, severe headaches, or abdominal cramping.  If you are using any tobacco products, including cigarettes, chewing tobacco, and electronic cigarettes.  If you have any questions. Other tests or screenings that may be performed during your third trimester include:  Blood tests that check for low iron levels (anemia).  Fetal testing to check the health, activity level, and growth of the fetus. Testing is done if you have certain medical conditions or if there are problems during the pregnancy.  Nonstress test  (NST). This test checks the health of your baby to make sure there are no signs of problems, such as the baby not getting enough oxygen. During this test, a belt is placed around your belly. The baby is made to move, and its heart rate is monitored during movement. What is false labor? False labor is a condition in which you feel small, irregular tightenings of the muscles in the womb (contractions) that usually go away with rest, changing position, or drinking water. These are called Braxton Hicks contractions. Contractions may last for hours, days, or even weeks before true labor sets in. If contractions come at regular intervals, become more frequent, increase in intensity, or become painful, you should see your health care provider. What are the signs of labor?  Abdominal cramps.  Regular contractions that start at 10 minutes apart and become stronger and more frequent with time.  Contractions that start on the top of the uterus and spread down to the lower abdomen and back.  Increased pelvic pressure and dull back pain.  A watery or bloody mucus discharge that comes from the vagina.  Leaking of amniotic fluid. This is also known as your "water breaking." It could be a slow trickle or a gush. Let your health care provider know if it has a color or strange odor. If you have any of these signs, call your health care provider right away, even if it is before your due date. Follow these instructions at home: Medicines  Follow your health care provider's instructions regarding medicine use. Specific medicines may be either safe or unsafe to take during pregnancy.  Take a prenatal vitamin that contains at least 600 micrograms (mcg) of folic acid.  If you develop constipation, try taking a stool softener if your health care provider approves. Eating and drinking   Eat a balanced diet that includes fresh fruits and vegetables, whole grains, good sources of protein such as meat, eggs, or tofu,  and low-fat dairy. Your health care provider will help you determine the amount of weight gain that is right for you.  Avoid raw meat and uncooked cheese. These carry germs that can cause birth defects in the baby.  If you have low calcium intake from food, talk to your health care provider about whether you should take a daily calcium supplement.  Eat four or five small meals rather than three large meals a day.  Limit foods that are high in fat and processed sugars, such as fried and sweet foods.  To prevent constipation: ? Drink enough fluid to keep your urine clear or pale yellow. ? Eat foods that are high in fiber, such as fresh fruits and vegetables, whole grains, and beans. Activity  Exercise only as directed by your health care provider. Most women can continue their usual exercise routine during pregnancy. Try to exercise for 30 minutes at least 5 days a week. Stop exercising if you experience uterine contractions.  Avoid heavy lifting.  Do   not exercise in extreme heat or humidity, or at high altitudes.  Wear low-heel, comfortable shoes.  Practice good posture.  You may continue to have sex unless your health care provider tells you otherwise. Relieving pain and discomfort  Take frequent breaks and rest with your legs elevated if you have leg cramps or low back pain.  Take warm sitz baths to soothe any pain or discomfort caused by hemorrhoids. Use hemorrhoid cream if your health care provider approves.  Wear a good support bra to prevent discomfort from breast tenderness.  If you develop varicose veins: ? Wear support pantyhose or compression stockings as told by your healthcare provider. ? Elevate your feet for 15 minutes, 3-4 times a day. Prenatal care  Write down your questions. Take them to your prenatal visits.  Keep all your prenatal visits as told by your health care provider. This is important. Safety  Wear your seat belt at all times when driving.  Make  a list of emergency phone numbers, including numbers for family, friends, the hospital, and police and fire departments. General instructions  Avoid cat litter boxes and soil used by cats. These carry germs that can cause birth defects in the baby. If you have a cat, ask someone to clean the litter box for you.  Do not travel far distances unless it is absolutely necessary and only with the approval of your health care provider.  Do not use hot tubs, steam rooms, or saunas.  Do not drink alcohol.  Do not use any products that contain nicotine or tobacco, such as cigarettes and e-cigarettes. If you need help quitting, ask your health care provider.  Do not use any medicinal herbs or unprescribed drugs. These chemicals affect the formation and growth of the baby.  Do not douche or use tampons or scented sanitary pads.  Do not cross your legs for long periods of time.  To prepare for the arrival of your baby: ? Take prenatal classes to understand, practice, and ask questions about labor and delivery. ? Make a trial run to the hospital. ? Visit the hospital and tour the maternity area. ? Arrange for maternity or paternity leave through employers. ? Arrange for family and friends to take care of pets while you are in the hospital. ? Purchase a rear-facing car seat and make sure you know how to install it in your car. ? Pack your hospital bag. ? Prepare the baby's nursery. Make sure to remove all pillows and stuffed animals from the baby's crib to prevent suffocation.  Visit your dentist if you have not gone during your pregnancy. Use a soft toothbrush to brush your teeth and be gentle when you floss. Contact a health care provider if:  You are unsure if you are in labor or if your water has broken.  You become dizzy.  You have mild pelvic cramps, pelvic pressure, or nagging pain in your abdominal area.  You have lower back pain.  You have persistent nausea, vomiting, or  diarrhea.  You have an unusual or bad smelling vaginal discharge.  You have pain when you urinate. Get help right away if:  Your water breaks before 37 weeks.  You have regular contractions less than 5 minutes apart before 37 weeks.  You have a fever.  You are leaking fluid from your vagina.  You have spotting or bleeding from your vagina.  You have severe abdominal pain or cramping.  You have rapid weight loss or weight gain.  You have   shortness of breath with chest pain.  You notice sudden or extreme swelling of your face, hands, ankles, feet, or legs.  Your baby makes fewer than 10 movements in 2 hours.  You have severe headaches that do not go away when you take medicine.  You have vision changes. Summary  The third trimester is from week 28 through week 40, months 7 through 9. The third trimester is a time when the unborn baby (fetus) is growing rapidly.  During the third trimester, your discomfort may increase as you and your baby continue to gain weight. You may have abdominal, leg, and back pain, sleeping problems, and an increased need to urinate.  During the third trimester your breasts will keep growing and they will continue to become tender. A yellow fluid (colostrum) may leak from your breasts. This is the first milk you are producing for your baby.  False labor is a condition in which you feel small, irregular tightenings of the muscles in the womb (contractions) that eventually go away. These are called Braxton Hicks contractions. Contractions may last for hours, days, or even weeks before true labor sets in.  Signs of labor can include: abdominal cramps; regular contractions that start at 10 minutes apart and become stronger and more frequent with time; watery or bloody mucus discharge that comes from the vagina; increased pelvic pressure and dull back pain; and leaking of amniotic fluid. This information is not intended to replace advice given to you by your  health care provider. Make sure you discuss any questions you have with your health care provider. Document Revised: 08/12/2018 Document Reviewed: 05/27/2016 Elsevier Patient Education  2020 [employer]  

## 2019-08-09 NOTE — Progress Notes (Signed)
   PRENATAL VISIT NOTE  Subjective:  Brandi Russo is a 34 y.o. G2P1001 at [redacted]w[redacted]d being seen today for ongoing prenatal care.  She is currently monitored for the following issues for this high-risk pregnancy and has Supervision of other normal pregnancy, antepartum; Prediabetes; COVID-19 affecting pregnancy in second trimester; Gestational diabetes; Hemorrhoids during pregnancy; and Bilateral carpal tunnel syndrome on their problem list.  Patient reports no complaints.  Contractions: Irritability. Vag. Bleeding: None.  Movement: Present. Denies leaking of fluid.   The following portions of the patient's history were reviewed and updated as appropriate: allergies, current medications, past family history, past medical history, past social history, past surgical history and problem list.   Objective:   Vitals:   08/09/19 0816  BP: 132/78  Pulse: 89    Fetal Status: Fetal Heart Rate (bpm): 145   Movement: Present     General:  Alert, oriented and cooperative. Patient is in no acute distress.  Skin: Skin is warm and dry. No rash noted.   Cardiovascular: Normal heart rate noted  Respiratory: Normal respiratory effort, no problems with respiration noted  Abdomen: Soft, gravid, appropriate for gestational age.  Pain/Pressure: Present     Pelvic: Cervical exam deferred        Extremities: Normal range of motion.  Edema: None  Mental Status: Normal mood and affect. Normal behavior. Normal judgment and thought content.   Assessment and Plan:  Pregnancy: G2P1001 at [redacted]w[redacted]d 1. Supervision of other normal pregnancy, antepartum      TDAP today      Discussed Fetal Movement      Has followup USs scheduled - CBC - HIV antibody (with reflex) - RPR  2. Diet controlled gestational diabetes mellitus (GDM), antepartum      FBSs improved.  One was 97 and two others high 90s      2 hr PC values all normal except 3 ([phone])    Recommend stay on nightly dose of meformin.  Preterm labor symptoms  and general obstetric precautions including but not limited to vaginal bleeding, contractions, leaking of fluid and fetal movement were reviewed in detail with the patient. Please refer to After Visit Summary for other counseling recommendations.   Return in about 2 weeks (around 08/23/2019) for [facility].  Future Appointments  Date Time Provider Department Center  09/05/2019  3:30 PM WH-MFC NURSE WH-MFC MFC-US  09/05/2019  3:30 PM WH-MFC [place] 1 WH-MFCUS MFC-US  09/12/2019  3:30 PM WH-MFC NURSE WH-MFC MFC-US  09/12/2019  3:30 PM WH-MFC [place] 1 WH-MFCUS MFC-US  09/19/2019  3:30 PM WH-MFC NURSE WH-MFC MFC-US  09/19/2019  3:30 PM WH-MFC [place] 5 WH-MFCUS MFC-US    Wynelle Bourgeois, CNM

## 2019-08-10 LAB — CBC
Hematocrit: 36.4 % (ref 34.0–46.6)
Hemoglobin: 11.9 g/dL (ref 11.1–15.9)
MCH: 28.5 pg (ref 26.6–33.0)
MCHC: 32.7 g/dL (ref 31.5–35.7)
MCV: 87 fL (ref 79–97)
Platelets: 210 10*3/uL (ref 150–450)
RBC: 4.18 x10E6/uL (ref 3.77–5.28)
RDW: 12.3 % (ref 11.7–15.4)
WBC: 8.2 10*3/uL (ref 3.4–10.8)

## 2019-08-10 LAB — RPR: RPR Ser Ql: NONREACTIVE

## 2019-08-10 LAB — HIV ANTIBODY (ROUTINE TESTING W REFLEX): HIV Screen 4th Generation wRfx: NONREACTIVE

## 2019-08-12 MED FILL — METFORMIN HCL 500 MG TABS: 500 | 30 days supply | Qty: 30 | Fill #1

## 2019-08-23 ENCOUNTER — Other Ambulatory Visit: Payer: Self-pay

## 2019-08-23 ENCOUNTER — Ambulatory Visit (INDEPENDENT_AMBULATORY_CARE_PROVIDER_SITE_OTHER): Payer: 59 | Admitting: Advanced Practice Midwife

## 2019-08-23 ENCOUNTER — Encounter: Payer: Self-pay | Admitting: Advanced Practice Midwife

## 2019-08-23 DIAGNOSIS — O24410 Gestational diabetes mellitus in pregnancy, diet controlled: Secondary | ICD-10-CM

## 2019-08-23 DIAGNOSIS — Z3480 Encounter for supervision of other normal pregnancy, unspecified trimester: Secondary | ICD-10-CM

## 2019-08-23 DIAGNOSIS — Z3A30 30 weeks gestation of pregnancy: Secondary | ICD-10-CM

## 2019-08-23 NOTE — Patient Instructions (Signed)
Third Trimester of Pregnancy The third trimester is from week 28 through week 40 (months 7 through 9). The third trimester is a time when the unborn baby (fetus) is growing rapidly. At the end of the ninth month, the fetus is about 20 inches in length and weighs 6-10 pounds. Body changes during your third trimester Your body will continue to go through many changes during pregnancy. The changes vary from woman to woman. During the third trimester:  Your weight will continue to increase. You can expect to gain 25-35 pounds (11-16 kg) by the end of the pregnancy.  You may begin to get stretch marks on your hips, abdomen, and breasts.  You may urinate more often because the fetus is moving lower into your pelvis and pressing on your bladder.  You may develop or continue to have heartburn. This is caused by increased hormones that slow down muscles in the digestive tract.  You may develop or continue to have constipation because increased hormones slow digestion and cause the muscles that push waste through your intestines to relax.  You may develop hemorrhoids. These are swollen veins (varicose veins) in the rectum that can itch or be painful.  You may develop swollen, bulging veins (varicose veins) in your legs.  You may have increased body aches in the pelvis, back, or thighs. This is due to weight gain and increased hormones that are relaxing your joints.  You may have changes in your hair. These can include thickening of your hair, rapid growth, and changes in texture. Some women also have hair loss during or after pregnancy, or hair that feels dry or thin. Your hair will most likely return to normal after your baby is born.  Your breasts will continue to grow and they will continue to become tender. A yellow fluid (colostrum) may leak from your breasts. This is the first milk you are producing for your baby.  Your belly button may stick out.  You may notice more swelling in your hands,  face, or ankles.  You may have increased tingling or numbness in your hands, arms, and legs. The skin on your belly may also feel numb.  You may feel short of breath because of your expanding uterus.  You may have more problems sleeping. This can be caused by the size of your belly, increased need to urinate, and an increase in your body's metabolism.  You may notice the fetus "dropping," or moving lower in your abdomen (lightening).  You may have increased vaginal discharge.  You may notice your joints feel loose and you may have pain around your pelvic bone. What to expect at prenatal visits You will have prenatal exams every 2 weeks until week 36. Then you will have weekly prenatal exams. During a routine prenatal visit:  You will be weighed to make sure you and the baby are growing normally.  Your blood pressure will be taken.  Your abdomen will be measured to track your baby's growth.  The fetal heartbeat will be listened to.  Any test results from the previous visit will be discussed.  You may have a cervical check near your due date to see if your cervix has softened or thinned (effaced).  You will be tested for Group B streptococcus. This happens between 35 and 37 weeks. Your health care provider may ask you:  What your birth plan is.  How you are feeling.  If you are feeling the baby move.  If you have had any abnormal   symptoms, such as leaking fluid, bleeding, severe headaches, or abdominal cramping.  If you are using any tobacco products, including cigarettes, chewing tobacco, and electronic cigarettes.  If you have any questions. Other tests or screenings that may be performed during your third trimester include:  Blood tests that check for low iron levels (anemia).  Fetal testing to check the health, activity level, and growth of the fetus. Testing is done if you have certain medical conditions or if there are problems during the pregnancy.  Nonstress test  (NST). This test checks the health of your baby to make sure there are no signs of problems, such as the baby not getting enough oxygen. During this test, a belt is placed around your belly. The baby is made to move, and its heart rate is monitored during movement. What is false labor? False labor is a condition in which you feel small, irregular tightenings of the muscles in the womb (contractions) that usually go away with rest, changing position, or drinking water. These are called Braxton Hicks contractions. Contractions may last for hours, days, or even weeks before true labor sets in. If contractions come at regular intervals, become more frequent, increase in intensity, or become painful, you should see your health care provider. What are the signs of labor?  Abdominal cramps.  Regular contractions that start at 10 minutes apart and become stronger and more frequent with time.  Contractions that start on the top of the uterus and spread down to the lower abdomen and back.  Increased pelvic pressure and dull back pain.  A watery or bloody mucus discharge that comes from the vagina.  Leaking of amniotic fluid. This is also known as your "water breaking." It could be a slow trickle or a gush. Let your health care provider know if it has a color or strange odor. If you have any of these signs, call your health care provider right away, even if it is before your due date. Follow these instructions at home: Medicines  Follow your health care provider's instructions regarding medicine use. Specific medicines may be either safe or unsafe to take during pregnancy.  Take a prenatal vitamin that contains at least 600 micrograms (mcg) of folic acid.  If you develop constipation, try taking a stool softener if your health care provider approves. Eating and drinking   Eat a balanced diet that includes fresh fruits and vegetables, whole grains, good sources of protein such as meat, eggs, or tofu,  and low-fat dairy. Your health care provider will help you determine the amount of weight gain that is right for you.  Avoid raw meat and uncooked cheese. These carry germs that can cause birth defects in the baby.  If you have low calcium intake from food, talk to your health care provider about whether you should take a daily calcium supplement.  Eat four or five small meals rather than three large meals a day.  Limit foods that are high in fat and processed sugars, such as fried and sweet foods.  To prevent constipation: ? Drink enough fluid to keep your urine clear or pale yellow. ? Eat foods that are high in fiber, such as fresh fruits and vegetables, whole grains, and beans. Activity  Exercise only as directed by your health care provider. Most women can continue their usual exercise routine during pregnancy. Try to exercise for 30 minutes at least 5 days a week. Stop exercising if you experience uterine contractions.  Avoid heavy lifting.  Do   not exercise in extreme heat or humidity, or at high altitudes.  Wear low-heel, comfortable shoes.  Practice good posture.  You may continue to have sex unless your health care provider tells you otherwise. Relieving pain and discomfort  Take frequent breaks and rest with your legs elevated if you have leg cramps or low back pain.  Take warm sitz baths to soothe any pain or discomfort caused by hemorrhoids. Use hemorrhoid cream if your health care provider approves.  Wear a good support bra to prevent discomfort from breast tenderness.  If you develop varicose veins: ? Wear support pantyhose or compression stockings as told by your healthcare provider. ? Elevate your feet for 15 minutes, 3-4 times a day. Prenatal care  Write down your questions. Take them to your prenatal visits.  Keep all your prenatal visits as told by your health care provider. This is important. Safety  Wear your seat belt at all times when driving.  Make  a list of emergency phone numbers, including numbers for family, friends, the hospital, and police and fire departments. General instructions  Avoid cat litter boxes and soil used by cats. These carry germs that can cause birth defects in the baby. If you have a cat, ask someone to clean the litter box for you.  Do not travel far distances unless it is absolutely necessary and only with the approval of your health care provider.  Do not use hot tubs, steam rooms, or saunas.  Do not drink alcohol.  Do not use any products that contain nicotine or tobacco, such as cigarettes and e-cigarettes. If you need help quitting, ask your health care provider.  Do not use any medicinal herbs or unprescribed drugs. These chemicals affect the formation and growth of the baby.  Do not douche or use tampons or scented sanitary pads.  Do not cross your legs for long periods of time.  To prepare for the arrival of your baby: ? Take prenatal classes to understand, practice, and ask questions about labor and delivery. ? Make a trial run to the hospital. ? Visit the hospital and tour the maternity area. ? Arrange for maternity or paternity leave through employers. ? Arrange for family and friends to take care of pets while you are in the hospital. ? Purchase a rear-facing car seat and make sure you know how to install it in your car. ? Pack your hospital bag. ? Prepare the baby's nursery. Make sure to remove all pillows and stuffed animals from the baby's crib to prevent suffocation.  Visit your dentist if you have not gone during your pregnancy. Use a soft toothbrush to brush your teeth and be gentle when you floss. Contact a health care provider if:  You are unsure if you are in labor or if your water has broken.  You become dizzy.  You have mild pelvic cramps, pelvic pressure, or nagging pain in your abdominal area.  You have lower back pain.  You have persistent nausea, vomiting, or  diarrhea.  You have an unusual or bad smelling vaginal discharge.  You have pain when you urinate. Get help right away if:  Your water breaks before 37 weeks.  You have regular contractions less than 5 minutes apart before 37 weeks.  You have a fever.  You are leaking fluid from your vagina.  You have spotting or bleeding from your vagina.  You have severe abdominal pain or cramping.  You have rapid weight loss or weight gain.  You have   shortness of breath with chest pain.  You notice sudden or extreme swelling of your face, hands, ankles, feet, or legs.  Your baby makes fewer than 10 movements in 2 hours.  You have severe headaches that do not go away when you take medicine.  You have vision changes. Summary  The third trimester is from week 28 through week 40, months 7 through 9. The third trimester is a time when the unborn baby (fetus) is growing rapidly.  During the third trimester, your discomfort may increase as you and your baby continue to gain weight. You may have abdominal, leg, and back pain, sleeping problems, and an increased need to urinate.  During the third trimester your breasts will keep growing and they will continue to become tender. A yellow fluid (colostrum) may leak from your breasts. This is the first milk you are producing for your baby.  False labor is a condition in which you feel small, irregular tightenings of the muscles in the womb (contractions) that eventually go away. These are called Braxton Hicks contractions. Contractions may last for hours, days, or even weeks before true labor sets in.  Signs of labor can include: abdominal cramps; regular contractions that start at 10 minutes apart and become stronger and more frequent with time; watery or bloody mucus discharge that comes from the vagina; increased pelvic pressure and dull back pain; and leaking of amniotic fluid. This information is not intended to replace advice given to you by your  health care provider. Make sure you discuss any questions you have with your health care provider. Document Revised: 08/12/2018 Document Reviewed: 05/27/2016 Elsevier Patient Education  2020 [employer]  

## 2019-08-23 NOTE — Progress Notes (Signed)
   PRENATAL VISIT NOTE  Subjective:  Brandi Russo is a 34 y.o. G2P1001 at [redacted]w[redacted]d being seen today for ongoing prenatal care.  She is currently monitored for the following issues for this high-risk pregnancy and has Supervision of other normal pregnancy, antepartum; Prediabetes; COVID-19 affecting pregnancy in second trimester; Gestational diabetes; Hemorrhoids during pregnancy; and Bilateral carpal tunnel syndrome on their problem list.  Patient reports no complaints.  Contractions: Irregular. Vag. Bleeding: None.  Movement: Present. Denies leaking of fluid.   The following portions of the patient's history were reviewed and updated as appropriate: allergies, current medications, past family history, past medical history, past social history, past surgical history and problem list.   Objective:   Vitals:   08/23/19 0800  BP: 102/71  Pulse: 100  Weight: 215 lb (97.5 kg)    Fetal Status: Fetal Heart Rate (bpm): 156   Movement: Present     General:  Alert, oriented and cooperative. Patient is in no acute distress.  Skin: Skin is warm and dry. No rash noted.   Cardiovascular: Normal heart rate noted  Respiratory: Normal respiratory effort, no problems with respiration noted  Abdomen: Soft, gravid, appropriate for gestational age.  Pain/Pressure: Absent     Pelvic: Cervical exam deferred        Extremities: Normal range of motion.  Edema: None  Mental Status: Normal mood and affect. Normal behavior. Normal judgment and thought content.   Assessment and Plan:  Pregnancy: G2P1001 at [redacted]w[redacted]d 1. Supervision of other normal pregnancy, antepartum      Occasional B-H contractions  2. 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 Please refer to After Visit Summary for other counseling recommendations.   Return in about 2 weeks (around 09/06/2019) for [facility].  Future Appointments  Date Time Provider Department Center  09/05/2019  3:30 PM WH-MFC NURSE WH-MFC MFC-US  09/05/2019  3:30 PM WH-MFC 11/05/2019 1 WH-MFCUS MFC-US  09/09/2019  8:30 AM Anyanwu, 11/09/2019, MD [facility] None  09/12/2019  3:30 PM WH-MFC NURSE WH-MFC MFC-US  09/12/2019  3:30 PM WH-MFC 11/12/2019 1 WH-MFCUS MFC-US  09/19/2019  3:30 PM WH-MFC NURSE WH-MFC MFC-US  09/19/2019  3:30 PM WH-MFC 09/21/2019 5 WH-MFCUS MFC-US    [place], CNM

## 2019-08-26 ENCOUNTER — Other Ambulatory Visit: Payer: Self-pay

## 2019-08-26 ENCOUNTER — Other Ambulatory Visit: Payer: Self-pay | Admitting: Advanced Practice Midwife

## 2019-08-26 DIAGNOSIS — Z3480 Encounter for supervision of other normal pregnancy, unspecified trimester: Secondary | ICD-10-CM

## 2019-08-26 DIAGNOSIS — O24410 Gestational diabetes mellitus in pregnancy, diet controlled: Secondary | ICD-10-CM

## 2019-08-26 DIAGNOSIS — R7303 Prediabetes: Secondary | ICD-10-CM

## 2019-08-26 DIAGNOSIS — K219 Gastro-esophageal reflux disease without esophagitis: Secondary | ICD-10-CM

## 2019-08-26 MED ORDER — METFORMIN HCL 500 MG PO TABS: 500.0000 mg | ORAL_TABLET | Freq: Every day | ORAL | 2 refills | Status: DC

## 2019-08-26 MED FILL — METFORMIN HCL 500 MG TABS: 500 | 30 days supply | Qty: 60 | Fill #0

## 2019-08-31 MED FILL — PANTOPRAZOLE SOD DR 20 MG T: 20 | 60 days supply | Qty: 60 | Fill #0

## 2019-09-05 ENCOUNTER — Ambulatory Visit: Payer: 59

## 2019-09-05 ENCOUNTER — Ambulatory Visit (HOSPITAL_COMMUNITY): Payer: 59 | Attending: Obstetrics and Gynecology

## 2019-09-08 DIAGNOSIS — Z3483 Encounter for supervision of other normal pregnancy, third trimester: Secondary | ICD-10-CM | POA: Diagnosis not present

## 2019-09-08 DIAGNOSIS — Z3482 Encounter for supervision of other normal pregnancy, second trimester: Secondary | ICD-10-CM | POA: Diagnosis not present

## 2019-09-09 ENCOUNTER — Other Ambulatory Visit: Payer: Self-pay

## 2019-09-09 ENCOUNTER — Encounter: Payer: Self-pay | Admitting: Obstetrics & Gynecology

## 2019-09-09 ENCOUNTER — Ambulatory Visit (INDEPENDENT_AMBULATORY_CARE_PROVIDER_SITE_OTHER): Payer: 59 | Admitting: Obstetrics & Gynecology

## 2019-09-09 VITALS — BP 125/70 | HR 90 | Wt 218.0 lb

## 2019-09-09 DIAGNOSIS — O0993 Supervision of high risk pregnancy, unspecified, third trimester: Secondary | ICD-10-CM

## 2019-09-09 DIAGNOSIS — O24415 Gestational diabetes mellitus in pregnancy, controlled by oral hypoglycemic drugs: Secondary | ICD-10-CM

## 2019-09-09 MED FILL — ADDERALL XR 30 MG CAP SA: 30 | 30 days supply | Qty: 30 | Fill #0

## 2019-09-09 NOTE — Patient Instructions (Signed)
Return to office for any scheduled appointments. Call the office or go to the [facility] at [facility] [place] [facility] if:  You begin to have strong, frequent contractions  Your water breaks.  Sometimes it is a big gush of fluid, sometimes it is just a trickle that keeps getting your panties wet or running down your legs  You have vaginal bleeding.  It is normal to have a small amount of spotting if your cervix was checked.   You do not feel your baby moving like normal.  If you do not, get something to eat and drink and lay down and focus on feeling your baby move.   If your baby is still not moving like normal, you should call the office or go to MAU.  Any other obstetric concerns.   

## 2019-09-09 NOTE — Progress Notes (Signed)
   PRENATAL VISIT NOTE  Subjective:  Brandi Russo is a 34 y.o. G2P1001 at [redacted]w[redacted]d being seen today for ongoing prenatal care.  She is currently monitored for the following issues for this high-risk pregnancy and has Supervision of high risk pregnancy in third trimester; Prediabetes; COVID-19 affecting pregnancy in second trimester; Gestational diabetes mellitus (GDM) in third trimester controlled on oral hypoglycemic drug; Hemorrhoids during pregnancy; and Bilateral carpal tunnel syndrome on their problem list.  Patient reports no complaints.  Contractions: Irritability. Vag. Bleeding: None.  Movement: Present. Denies leaking of fluid.   The following portions of the patient's history were reviewed and updated as appropriate: allergies, current medications, past family history, past medical history, past social history, past surgical history and problem list.   Objective:   Vitals:   09/09/19 0840  BP: 125/70  Pulse: 90  Weight: 218 lb (98.9 kg)    Fetal Status: Fetal Heart Rate (bpm): 145 Fundal Height: 33 cm Movement: Present     General:  Alert, oriented and cooperative. Patient is in no acute distress.  Skin: Skin is warm and dry. No rash noted.   Cardiovascular: Normal heart rate noted  Respiratory: Normal respiratory effort, no problems with respiration noted  Abdomen: Soft, gravid, appropriate for gestational age.  Pain/Pressure: Present     Pelvic: Cervical exam deferred        Extremities: Normal range of motion.  Edema: None  Mental Status: Normal mood and affect. Normal behavior. Normal judgment and thought content.   Assessment and Plan:  Pregnancy: G2P1001 at [redacted]w[redacted]d 1. Gestational diabetes mellitus (GDM) in third trimester controlled on oral hypoglycemic drug CBGs reviewed, uses application on phone (not Babys[employer]s). Continue Metformin 500 mg po bid. Continue weekly antenatal testing and scans as per MFM. IOL at 39 weeks.  2. Supervision of high risk pregnancy in third  trimester Discussed contraception, she is undecided. Preterm labor symptoms and general obstetric precautions including but not limited to vaginal bleeding, contractions, leaking of fluid and fetal movement were reviewed in detail with the patient. Please refer to After Visit Summary for other counseling recommendations.   Return in about 2 weeks (around 09/23/2019) for Virtual OB Visit then 3 weeks from now: OFFICE OB VISIT, Pelvic cultures.  Future Appointments  Date Time Provider Department Center  09/12/2019  3:30 PM [facility] NURSE [facility]  09/12/2019  3:30 PM [facility] US1 [facility]  09/19/2019  3:30 PM [facility] NURSE [facility]  09/19/2019  3:30 PM [facility] US5 [facility]  09/22/2019  9:00 AM Levie Heritage, DO [facility] None  09/29/2019  8:15 AM Willodean Rosenthal, MD [facility] None    Jaynie Collins, MD

## 2019-09-12 ENCOUNTER — Ambulatory Visit (HOSPITAL_COMMUNITY): Payer: 59 | Attending: Obstetrics and Gynecology

## 2019-09-12 ENCOUNTER — Encounter: Payer: Self-pay | Admitting: *Deleted

## 2019-09-12 ENCOUNTER — Other Ambulatory Visit: Payer: Self-pay

## 2019-09-12 ENCOUNTER — Ambulatory Visit: Payer: 59 | Admitting: *Deleted

## 2019-09-12 DIAGNOSIS — R7303 Prediabetes: Secondary | ICD-10-CM | POA: Diagnosis not present

## 2019-09-12 DIAGNOSIS — O2240 Hemorrhoids in pregnancy, unspecified trimester: Secondary | ICD-10-CM | POA: Insufficient documentation

## 2019-09-12 DIAGNOSIS — O24415 Gestational diabetes mellitus in pregnancy, controlled by oral hypoglycemic drugs: Secondary | ICD-10-CM

## 2019-09-12 DIAGNOSIS — Z3A33 33 weeks gestation of pregnancy: Secondary | ICD-10-CM | POA: Diagnosis not present

## 2019-09-12 DIAGNOSIS — O0993 Supervision of high risk pregnancy, unspecified, third trimester: Secondary | ICD-10-CM | POA: Insufficient documentation

## 2019-09-13 ENCOUNTER — Other Ambulatory Visit: Payer: Self-pay | Admitting: *Deleted

## 2019-09-13 DIAGNOSIS — O24415 Gestational diabetes mellitus in pregnancy, controlled by oral hypoglycemic drugs: Secondary | ICD-10-CM

## 2019-09-19 ENCOUNTER — Ambulatory Visit (HOSPITAL_COMMUNITY): Payer: 59 | Attending: Obstetrics and Gynecology

## 2019-09-19 ENCOUNTER — Ambulatory Visit: Payer: 59 | Admitting: *Deleted

## 2019-09-19 ENCOUNTER — Other Ambulatory Visit: Payer: Self-pay

## 2019-09-19 ENCOUNTER — Encounter: Payer: Self-pay | Admitting: *Deleted

## 2019-09-19 DIAGNOSIS — R7303 Prediabetes: Secondary | ICD-10-CM | POA: Diagnosis not present

## 2019-09-19 DIAGNOSIS — O0993 Supervision of high risk pregnancy, unspecified, third trimester: Secondary | ICD-10-CM | POA: Diagnosis not present

## 2019-09-19 DIAGNOSIS — Z3A34 34 weeks gestation of pregnancy: Secondary | ICD-10-CM | POA: Diagnosis not present

## 2019-09-19 DIAGNOSIS — O24415 Gestational diabetes mellitus in pregnancy, controlled by oral hypoglycemic drugs: Secondary | ICD-10-CM

## 2019-09-20 DIAGNOSIS — M79642 Pain in left hand: Secondary | ICD-10-CM | POA: Diagnosis not present

## 2019-09-20 DIAGNOSIS — G5603 Carpal tunnel syndrome, bilateral upper limbs: Secondary | ICD-10-CM | POA: Diagnosis not present

## 2019-09-22 ENCOUNTER — Telehealth (INDEPENDENT_AMBULATORY_CARE_PROVIDER_SITE_OTHER): Payer: 59 | Admitting: Family Medicine

## 2019-09-22 ENCOUNTER — Other Ambulatory Visit: Payer: Self-pay

## 2019-09-22 DIAGNOSIS — U071 COVID-19: Secondary | ICD-10-CM

## 2019-09-22 DIAGNOSIS — O98512 Other viral diseases complicating pregnancy, second trimester: Secondary | ICD-10-CM

## 2019-09-22 DIAGNOSIS — O0993 Supervision of high risk pregnancy, unspecified, third trimester: Secondary | ICD-10-CM

## 2019-09-22 DIAGNOSIS — O24415 Gestational diabetes mellitus in pregnancy, controlled by oral hypoglycemic drugs: Secondary | ICD-10-CM

## 2019-09-22 DIAGNOSIS — K219 Gastro-esophageal reflux disease without esophagitis: Secondary | ICD-10-CM

## 2019-09-22 NOTE — Progress Notes (Signed)
OBSTETRICS PRENATAL VIRTUAL VISIT ENCOUNTER NOTE  Provider location: Center for [facility]   I connected with Bridney Derenzo on 09/22/19 at  9:00 AM EDT by MyChart Video Encounter at home and verified that I am speaking with the correct person using two identifiers.   I discussed the limitations, risks, security and privacy concerns of performing an evaluation and management service virtually and the availability of in person appointments. I also discussed with the patient that there may be a patient responsible charge related to this service. The patient expressed understanding and agreed to proceed. Subjective:  Brandi Russo is a 34 y.o. G2P1001 at [redacted]w[redacted]d being seen today for ongoing prenatal care.  She is currently monitored for the following issues for this high-risk pregnancy and has Supervision of high risk pregnancy in third trimester; Prediabetes; COVID-19 affecting pregnancy in second trimester; Gestational diabetes mellitus (GDM) in third trimester controlled on oral hypoglycemic drug; Hemorrhoids during pregnancy; and Bilateral carpal tunnel syndrome on their problem list.  Patient reports no complaints.  Contractions: Not present. Vag. Bleeding: None.  Movement: Present. Denies any leaking of fluid.   GDM: Patient taking metformin  at night.  Reports no hypoglycemic episodes.  Tolerating medication well Fasting: 3 elevated in past week 2hr PP:    The following portions of the patient's history were reviewed and updated as appropriate: allergies, current medications, past family history, past medical history, past social history, past surgical history and problem list.   Objective:  There were no vitals filed for this visit.  Fetal Status:     Movement: Present     General:  Alert, oriented and cooperative. Patient is in no acute distress.  Respiratory: Normal respiratory effort, no problems with respiration noted  Mental Status: Normal mood  and affect. Normal behavior. Normal judgment and thought content.  Rest of physical exam deferred due to type of encounter  Imaging: [place] MFM FETAL BPP WO NON STRESS  Result Date: 09/19/2019 ----------------------------------------------------------------------  OBSTETRICS REPORT                       (Signed Final 09/19/2019 06:05 pm) ---------------------------------------------------------------------- Patient Info  ID #:       [id number]                          D.O.B.:  1985/05/19 (34 yrs)  Name:       Brandi Russo                    Visit Date: 09/19/2019 03:37 pm ---------------------------------------------------------------------- Performed By  Attending:        Noralee Space MD        Ref. Address:     [address] [place],  [place] [address]  Performed By:     Eden Lathe BS      [facility]                    RDMS RVT                                 Fetal Care  Referred By:      Willodean Rosenthal MD ---------------------------------------------------------------------- Orders  #  Description                           Code        Ordered By  1  [place] MFM FETAL BPP WO NON               76819.01    RAVI [facility]     STRESS ----------------------------------------------------------------------  #  Order #                     Accession #                Episode #  1  [id number]                   [id number]                 [id number] ---------------------------------------------------------------------- Indications  Encounter for other antenatal screening        Z36.2  follow-up  Gestational diabetes in pregnancy,             O24.415  controlled by oral hypoglycemic drugs  Metformin  Low Risk NIPS(Negative Quad)(WNL  ECHO)(Negative CF)  [redacted] weeks gestation of pregnancy                Z3A.34  ---------------------------------------------------------------------- Vital Signs                                                 Height:        5'4" ---------------------------------------------------------------------- Fetal Evaluation  Num Of Fetuses:         1  Fetal Heart Rate(bpm):  143  Cardiac Activity:       Observed  Presentation:           Cephalic  Amniotic Fluid  AFI FV:      Within normal limits  AFI Sum(cm)     %Tile       Largest Pocket(cm)  15.8            57          5.8  RUQ(cm)       RLQ(cm)       LUQ(cm)        LLQ(cm)  3.1           5.8           5              1.9 ---------------------------------------------------------------------- Biophysical Evaluation  Amniotic F.V:   Within  normal limits       F. Tone:        Observed  F. Movement:    Observed                   Score:          8/8  F. Breathing:   Observed ---------------------------------------------------------------------- OB History  Gravidity:    2         Term:   1  Living:       1 ---------------------------------------------------------------------- Gestational Age  LMP:           34w 5d        Date:  01/19/19                 EDD:   10/26/19  Best:          34w 5d     Det. By:  LMP  (01/19/19)          EDD:   10/26/19 ---------------------------------------------------------------------- Impression  Amniotic fluid is normal and good fetal activity is seen  .Antenatal testing is reassuring. BPP 8/8. ---------------------------------------------------------------------- Recommendations  -Continue weekly BPP till delivery. ----------------------------------------------------------------------                  Noralee Space, MD Electronically Signed Final Report   09/19/2019 06:05 pm ----------------------------------------------------------------------  [place] MFM FETAL BPP WO NON STRESS  Result Date: 09/12/2019 ----------------------------------------------------------------------  OBSTETRICS REPORT                         (Signed  Final 09/12/2019 04:44 pm) ---------------------------------------------------------------------- Patient Info  ID #:        [id number]                          D.O.B.:  [birth year] (34 yrs)  Name:        Brandi Russo                    Visit Date: 09/12/2019 04:28 pm ---------------------------------------------------------------------- Performed By  Attending:         Lin Landsman      Ref. Address:      [address]                     MD                                        [address] [place],                                                               [place] [address]  Performed By:      Emeline Darling BS,      [facility]                     RDMS  Fetal Care  Referred By:       Willodean RosenthalAROLYN                     HARRAWAY-SMITH                     MD ---------------------------------------------------------------------- Orders  #   Description                          Code         Ordered By  1   US MFM FETAL BPP WO NON              76819.01     RAVI [facility]ANK[facility]      STRESS  2   US MFM OB FOLLOW UP                  76816.01     [facility] ----------------------------------------------------------------------  #   Order #                    Accession #                 Episode #  1   [id number]                  [id number]                  [id number]  2   [id number]                  [id number]                  [id number] ---------------------------------------------------------------------- Indications  Encounter for other antenatal screening         Z36.2  follow-up  Gestational diabetes in pregnancy, controlled   O24.415  by oral hypoglycemic drugs Metformin  [redacted] weeks gestation of pregnancy                 Z3A.33  Low Risk NIPS(Negative Quad)(WNL  ECHO)(Negative CF) ---------------------------------------------------------------------- Vital Signs                                                  Height:        5'4"  ---------------------------------------------------------------------- Fetal Evaluation  Num Of Fetuses:          1  Fetal Heart Rate(bpm):   155  Cardiac Activity:        Observed  Presentation:            Cephalic  Placenta:                Posterior  P. Cord Insertion:       Previously Visualized  Amniotic Fluid  AFI FV:      Within normal limits  AFI Sum(cm)     %Tile       Largest Pocket(cm)  18.7            69          7.3  RUQ(cm)       RLQ(cm)        LUQ(cm)        LLQ(cm)  7.3           2.5  4              4.9 ---------------------------------------------------------------------- Biophysical Evaluation  Amniotic F.V:   Pocket => 2 cm              F. Tone:         Observed  F. Movement:    Observed                    Score:           8/8  F. Breathing:   Observed ---------------------------------------------------------------------- Biometry  BPD:        81   mm     G. Age:  32w 4d         15  %    CI:         75.55  %    70 - 86                                                           FL/HC:       22.6  %    19.4 - 21.8  HC:      295.5   mm     G. Age:  32w 5d        3.6  %    HC/AC:       0.91       0.96 - 1.11  AC:      325.3   mm     G. Age:  36w 3d         98  %    FL/BPD:      82.5  %    71 - 87  FL:       66.8   mm     G. Age:  34w 3d         58  %    FL/AC:       20.5  %    20 - 24  Est. FW:    2584   gm    5 lb 11 oz      82  % ---------------------------------------------------------------------- OB History  Gravidity:     2         Term:  1  Living:        1 ---------------------------------------------------------------------- Gestational Age  LMP:            33w 5d       Date:  01/19/19                   EDD:  10/26/19  U/S Today:      34w 0d                                         EDD:  10/24/19  Best:           33w 5d    Det. By:  LMP  (01/19/19)            EDD:  10/26/19 ---------------------------------------------------------------------- Anatomy  Cranium:                Appears  normal         Aortic  Arch:            Previously seen  Cavum:                  Previously seen        Ductal Arch:            Previously seen  Ventricles:             Previously seen        Diaphragm:              Previously seen  Choroid Plexus:         Previously seen        Stomach:                Appears normal, left                                                                         sided  Cerebellum:             Previously seen        Abdomen:                Previously seen  Posterior Fossa:        Previously seen        Abdominal Wall:         Previously seen  Nuchal Fold:            Previously seen        Cord Vessels:           Previously seen  Face:                   Orbits and profile     Kidneys:                Previously seen                          previously seen  Lips:                   Previously seen        Bladder:                Appears normal  Thoracic:               Previously seen        Spine:                  Previously seen  Heart:                  Previously seen        Upper Extremities:      Previously seen  RVOT:                   Previously seen        Lower Extremities:      Previously seen  LVOT:                   Previously seen  Other:   Female gender previously vis. Hands, Feet, and 5th digit visualized  previously. Technically difficult due to maternal habitus and fetal position. ---------------------------------------------------------------------- Cervix Uterus Adnexa  Cervix  Not visualized (advanced GA >24wks) ---------------------------------------------------------------------- Impression  Follow up growth given A2GDM  Normal interval growth with measurements consistent with  dates  Biophysical profile 8/8  Low risk NIPS ---------------------------------------------------------------------- Recommendations  Continue weekly testing  Follow up growth in 4 weeks ----------------------------------------------------------------------               Lin Landsman, MD Electronically Signed Final Report   09/12/2019 04:44 pm ----------------------------------------------------------------------  [place] MFM OB FOLLOW UP  Result Date: 09/12/2019 ----------------------------------------------------------------------  OBSTETRICS REPORT                         (Signed Final 09/12/2019 04:44 pm) ---------------------------------------------------------------------- Patient Info  ID #:        [id number]                          D.O.B.:  12-Jan-1986 (34 yrs)  Name:        Brandi Russo                    Visit Date: 09/12/2019 04:28 pm ---------------------------------------------------------------------- Performed By  Attending:         Lin Landsman      Ref. Address:      [address]                     MD                                        [address] [place],                                                               [place] [address]  Performed By:      Emeline Darling BS,      [facility]                     RDMS                                      Fetal Care  Referred By:       Willodean Rosenthal                     MD ---------------------------------------------------------------------- Orders  #   Description                          Code         Ordered By  1   [place] MFM FETAL BPP WO NON              28413.24     RAVI SHANKAR      STRESS  2   [place] MFM OB FOLLOW UP  60109.32     [place] ----------------------------------------------------------------------  #   Order #                    Accession #                 Episode #  1   [id number]                  [id number]                  [id number]  2   [id number]                  [id number]                  [id number] ---------------------------------------------------------------------- Indications  Encounter for other antenatal screening         Z36.2  follow-up  Gestational diabetes in pregnancy, controlled   O24.415  by oral hypoglycemic drugs Metformin   [redacted] weeks gestation of pregnancy                 Z3A.33  Low Risk NIPS(Negative Quad)(WNL  ECHO)(Negative CF) ---------------------------------------------------------------------- Vital Signs                                                  Height:        5'4" ---------------------------------------------------------------------- Fetal Evaluation  Num Of Fetuses:          1  Fetal Heart Rate(bpm):   155  Cardiac Activity:        Observed  Presentation:            Cephalic  Placenta:                Posterior  P. Cord Insertion:       Previously Visualized  Amniotic Fluid  AFI FV:      Within normal limits  AFI Sum(cm)     %Tile       Largest Pocket(cm)  18.7            69          7.3  RUQ(cm)       RLQ(cm)        LUQ(cm)        LLQ(cm)  7.3           2.5            4              4.9 ---------------------------------------------------------------------- Biophysical Evaluation  Amniotic F.V:   Pocket => 2 cm              F. Tone:         Observed  F. Movement:    Observed                    Score:           8/8  F. Breathing:   Observed ---------------------------------------------------------------------- Biometry  BPD:        81   mm     G. Age:  32w 4d         15  %    CI:         75.55  %    70 - 86  FL/HC:       22.6  %    19.4 - 21.8  HC:      295.5   mm     G. Age:  32w 5d        3.6  %    HC/AC:       0.91       0.96 - 1.11  AC:      325.3   mm     G. Age:  36w 3d         98  %    FL/BPD:      82.5  %    71 - 87  FL:       66.8   mm     G. Age:  34w 3d         58  %    FL/AC:       20.5  %    20 - 24  Est. FW:    2584   gm    5 lb 11 oz      82  % ---------------------------------------------------------------------- OB History  Gravidity:     2         Term:  1  Living:        1 ---------------------------------------------------------------------- Gestational Age  LMP:            33w 5d       Date:  01/19/19                   EDD:  10/26/19  U/S  Today:      34w 0d                                         EDD:  10/24/19  Best:           33w 5d    Det. By:  LMP  (01/19/19)            EDD:  10/26/19 ---------------------------------------------------------------------- Anatomy  Cranium:                Appears normal         Aortic Arch:            Previously seen  Cavum:                  Previously seen        Ductal Arch:            Previously seen  Ventricles:             Previously seen        Diaphragm:              Previously seen  Choroid Plexus:         Previously seen        Stomach:                Appears normal, left                                                                         sided  Cerebellum:             Previously seen        Abdomen:                Previously seen  Posterior Fossa:        Previously seen        Abdominal Wall:         Previously seen  Nuchal Fold:            Previously seen        Cord Vessels:           Previously seen  Face:                   Orbits and profile     Kidneys:                Previously seen                          previously seen  Lips:                   Previously seen        Bladder:                Appears normal  Thoracic:               Previously seen        Spine:                  Previously seen  Heart:                  Previously seen        Upper Extremities:      Previously seen  RVOT:                   Previously seen        Lower Extremities:      Previously seen  LVOT:                   Previously seen  Other:   Female gender previously vis. Hands, Feet, and 5th digit visualized           previously. Technically difficult due to maternal habitus and fetal position. ---------------------------------------------------------------------- Cervix Uterus Adnexa  Cervix  Not visualized (advanced GA >24wks) ---------------------------------------------------------------------- Impression  Follow up growth given A2GDM  Normal interval growth with measurements consistent with  dates  Biophysical  profile 8/8  Low risk NIPS ---------------------------------------------------------------------- Recommendations  Continue weekly testing  Follow up growth in 4 weeks ----------------------------------------------------------------------               Lin Landsman, MD Electronically Signed Final Report   09/12/2019 04:44 pm ----------------------------------------------------------------------   Assessment and Plan:  Pregnancy: G2P1001 at [redacted]w[redacted]d  1. Supervision of high risk pregnancy in third trimester FHT and FH normal  2. Gestational diabetes mellitus (GDM) in third trimester controlled on oral hypoglycemic drug Patient not having bedtime snack. Will have patient start having bedtime snack to see if this decreases her fasting CBG Antenatal testing Delivery at 39 wks Continue metfomrin   3. COVID-19 affecting pregnancy in second trimester No complications  4. Gastroesophageal reflux disease without esophagitis On protonix   Preterm labor symptoms and general obstetric precautions including but not limited to vaginal bleeding, contractions, leaking of fluid and fetal movement were reviewed in detail with the patient. I  discussed the assessment and treatment plan with the patient. The patient was provided an opportunity to ask questions and all were answered. The patient agreed with the plan and demonstrated an understanding of the instructions. The patient was advised to call back or seek an in-person office evaluation/go to [facility]U at [facility] for any urgent or concerning symptoms. Please refer to After Visit Summary for other counseling recommendations.   I provided 14 minutes of face-to-face time during this encounter.  Return in about 1 week (around 09/29/2019).  Future Appointments  Date Time Provider Department Center  09/27/2019  3:30 PM [facility] NURSE [facility]  09/27/2019  3:30 PM [facility] US3 [facility]  09/29/2019  8:15 AM Willodean Rosenthal, MD  [facility] None  10/04/2019  3:45 PM [facility] NURSE [facility]  10/04/2019  3:45 PM [facility] US1 [facility]  10/07/2019  8:15 AM Willodean Rosenthal, MD [facility] None  10/12/2019  3:30 PM [facility] NURSE [facility]  10/12/2019  3:30 PM [facility] US5 [facility]  10/13/2019  4:15 PM Annemarie Sebree, Rhona Raider, DO [facility] None    Levie Heritage, DO Center for [employer], [facility]

## 2019-09-27 ENCOUNTER — Encounter: Payer: Self-pay | Admitting: *Deleted

## 2019-09-27 ENCOUNTER — Ambulatory Visit: Payer: 59 | Admitting: *Deleted

## 2019-09-27 ENCOUNTER — Other Ambulatory Visit: Payer: Self-pay

## 2019-09-27 ENCOUNTER — Ambulatory Visit: Payer: 59 | Attending: Obstetrics and Gynecology

## 2019-09-27 DIAGNOSIS — O24415 Gestational diabetes mellitus in pregnancy, controlled by oral hypoglycemic drugs: Secondary | ICD-10-CM | POA: Diagnosis not present

## 2019-09-27 DIAGNOSIS — Z3A35 35 weeks gestation of pregnancy: Secondary | ICD-10-CM | POA: Diagnosis not present

## 2019-09-27 DIAGNOSIS — O0993 Supervision of high risk pregnancy, unspecified, third trimester: Secondary | ICD-10-CM | POA: Diagnosis not present

## 2019-09-27 DIAGNOSIS — R7303 Prediabetes: Secondary | ICD-10-CM | POA: Insufficient documentation

## 2019-09-27 DIAGNOSIS — O2240 Hemorrhoids in pregnancy, unspecified trimester: Secondary | ICD-10-CM | POA: Diagnosis not present

## 2019-09-29 ENCOUNTER — Other Ambulatory Visit: Payer: Self-pay

## 2019-09-29 ENCOUNTER — Other Ambulatory Visit (HOSPITAL_COMMUNITY)
Admission: RE | Admit: 2019-09-29 | Discharge: 2019-09-29 | Disposition: A | Discharge status: Home / Self Care | Payer: 59 | Source: Ambulatory Visit | Attending: Obstetrics & Gynecology | Admitting: Obstetrics & Gynecology

## 2019-09-29 ENCOUNTER — Encounter: Payer: Self-pay | Admitting: Obstetrics & Gynecology

## 2019-09-29 ENCOUNTER — Ambulatory Visit (INDEPENDENT_AMBULATORY_CARE_PROVIDER_SITE_OTHER): Payer: 59 | Admitting: Obstetrics & Gynecology

## 2019-09-29 VITALS — BP 128/73 | HR 83 | Wt 221.0 lb

## 2019-09-29 DIAGNOSIS — O24415 Gestational diabetes mellitus in pregnancy, controlled by oral hypoglycemic drugs: Secondary | ICD-10-CM | POA: Diagnosis not present

## 2019-09-29 DIAGNOSIS — Z3480 Encounter for supervision of other normal pregnancy, unspecified trimester: Secondary | ICD-10-CM

## 2019-09-29 DIAGNOSIS — R7303 Prediabetes: Secondary | ICD-10-CM

## 2019-09-29 DIAGNOSIS — O0993 Supervision of high risk pregnancy, unspecified, third trimester: Secondary | ICD-10-CM

## 2019-09-29 DIAGNOSIS — B3731 Acute candidiasis of vulva and vagina: Secondary | ICD-10-CM

## 2019-09-29 DIAGNOSIS — O24410 Gestational diabetes mellitus in pregnancy, diet controlled: Secondary | ICD-10-CM

## 2019-09-29 DIAGNOSIS — Z3A36 36 weeks gestation of pregnancy: Secondary | ICD-10-CM

## 2019-09-29 DIAGNOSIS — B373 Candidiasis of vulva and vagina: Secondary | ICD-10-CM

## 2019-09-29 MED ORDER — METFORMIN HCL 500 MG PO TABS: ORAL_TABLET | ORAL | 2 refills | Status: DC

## 2019-09-29 MED ORDER — TERCONAZOLE 0.4 % VA CREA: 1.0000 | TOPICAL_CREAM | Freq: Every day | VAGINAL | 0 refills | Status: DC

## 2019-09-29 MED FILL — TERCONAZOLE 0.4% CREAM: 0.4 | 7 days supply | Qty: 45 | Fill #0

## 2019-09-29 MED FILL — METFORMIN HCL 500 MG TABS: 500 | 20 days supply | Qty: 60 | Fill #0

## 2019-09-29 NOTE — Progress Notes (Signed)
   PRENATAL VISIT NOTE  Subjective:  Brandi Russo is a 34 y.o. G2P1001 at [redacted]w[redacted]d being seen today for ongoing prenatal care.  She is currently monitored for the following issues for this high-risk pregnancy and has Supervision of high risk pregnancy in third trimester; Prediabetes; COVID-19 affecting pregnancy in second trimester; Gestational diabetes mellitus (GDM) in third trimester controlled on oral hypoglycemic drug; Hemorrhoids during pregnancy; and Bilateral carpal tunnel syndrome on their problem list.  Patient reports vaginal itching and increased discharge.  Contractions: Not present. Vag. Bleeding: None.  Movement: Present. Denies leaking of fluid.   The following portions of the patient's history were reviewed and updated as appropriate: allergies, current medications, past family history, past medical history, past social history, past surgical history and problem list.   Objective:   Vitals:   09/29/19 0816  BP: 128/73  Pulse: 83  Weight: 221 lb (100.2 kg)    Fetal Status: Fetal Heart Rate (bpm): 148   Movement: Present     General:  Alert, oriented and cooperative. Patient is in no acute distress.  Skin: Skin is warm and dry. No rash noted.   Cardiovascular: Normal heart rate noted  Respiratory: Normal respiratory effort, no problems with respiration noted  Abdomen: Soft, gravid, appropriate for gestational age.  Pain/Pressure: Present     Pelvic: Cervical exam performed in the presence of a chaperone        Extremities: Normal range of motion.  Edema: None  Mental Status: Normal mood and affect. Normal behavior. Normal judgment and thought content.   Assessment and Plan:  Pregnancy: G2P1001 at [redacted]w[redacted]d 1. Supervision of high risk pregnancy in third trimester FH and FHR WNL - Culture, beta strep (group b only) - Cervicovaginal ancillary only( [place])  2. Gestational diabetes mellitus (GDM) in third trimester controlled on oral hypoglycemic drug > 50% of fasting  levels are increased. Will increase Metformin to 500mg  in the am and 1000mg  before bedtime  - Culture, beta strep (group b only) - Cervicovaginal ancillary only( [place])  3. Yeast vaginitis Terazol cream  4. Hemorrhoids Reviewed management   Preterm labor symptoms and general obstetric precautions including but not limited to vaginal bleeding, contractions, leaking of fluid and fetal movement were reviewed in detail with the patient. Please refer to After Visit Summary for other counseling recommendations.   Return in about 1 week (around 10/06/2019).  Future Appointments  Date Time Provider Department Center  10/04/2019  3:45 PM [facility] NURSE [facility]  10/04/2019  3:45 PM [facility] US1 [facility]  10/07/2019  8:15 AM [facility] MD [facility] None  10/12/2019  3:30 PM [facility] NURSE [facility]  10/12/2019  3:30 PM [facility] US5 [facility]  10/13/2019  4:15 PM [place], [facility] DO [facility] None    12/13/2019, MD

## 2019-09-29 NOTE — Patient Instructions (Addendum)
Natural Childbirth Natural childbirth is when labor and delivery progresses naturally with minimal medical assistance or medicines. Natural childbirth may be an option if you have a low risk pregnancy. With the help of a birthing professional such as a midwife, doula, or other health care provider, you may be able to use relaxation techniques and controlled breathing to manage pain during labor. Many women choose natural childbirth because it makes them feel more in control and in touch with the experience of giving birth. Some women also choose natural childbirth because they are concerned that medicines may affect them and their babies. What types of natural childbirth techniques are available? There are two types of natural childbirth techniques, which are taught in classes:  The Lamaze method. In these classes, parents learn that having a baby is normal, healthy, and natural. Mothers are taught to take a neutral position regarding pain medicine and numbing medicines, and to make an informed decision about using these medicines if the time comes.  The Bradley method, also called husband-coached birth. In these classes, the father or partner learns to be the birth coach. The mother is encouraged to exercise and eat a balanced, nutritious diet. Both parents also learn relaxation and deep breathing exercises and are taught how to prepare for emergency situations. What are some natural pain and relaxation techniques? If you are considering a natural childbirth, you should explore your options for managing pain and discomfort during your labor and delivery. Some natural pain and relaxation techniques include:  Meditation.  Yoga.  Hypnosis.  Acupuncture.  Massage.  Changing positions, such as by walking, rocking, showering, or leaning on birth balls.  Lying in warm water or a whirlpool bath.  Finding an activity that keeps your mind off the labor pain.  Listening to soft music.  Focusing  on a particular object (visual imagery). How can I prepare for a natural birth?  Talk with your spouse or partner about your goals for having a natural childbirth.  Decide if you will have your baby in the hospital, at a birthing center, or at home.  Have your spouse or partner attend the natural childbirth technique classes with you.  Decide which type of health care provider you would like to assist you with your delivery.  If you have other children, make plans to have someone take care of them when you go to the hospital or birthing center.  Know the distance and the time it takes to go to the hospital or birthing center. Practice going there and time it to be sure.  Have a bag packed with a nightgown, bathrobe, and toiletries. Be ready to take it with you when you go into labor.  Keep phone numbers of your family and friends handy if you need to call someone when you go into labor.  Talk with your health care provider about the possibility of a medical emergency and what will happen if that occurs. What are the advantages and disadvantages of natural childbirth? Advantages  You are in control of your labor and delivery experience.  You may be able to avoid some common medical practices, such as getting medicines or being monitored all the time.  You and your spouse or partner will work together, which can increase your bond with each other.  In most delivery centers, your family and friends can be involved in the labor and delivery process. Disadvantages  The methods of helping relieve labor pains may not work for you.  You may feel   disappointed if you change your mind during labor and decide not to have a natural childbirth. What can I expect after delivery?  You may feel very tired.  You may feel uncomfortable as your uterus contracts.  The area around your vagina will feel sore.  You may feel cold and shaky. What questions should I ask Tessla health care provider?   Am I a good candidate for natural childbirth?  Can you refer me to classes to learn more about natural childbirth?  How do I create a birth plan that outlines [place] wishes for natural childbirth? Where to find more information  American Pregnancy Association: americanpregnancy.org  [employer] of Obstetricians and Gynecologists: acog.org  [employer] of Nurse-Midwives: www.midwife.org Summary  Natural childbirth may be an option if you have a low risk pregnancy.  The Elige Radon or Lamaze Methods are techniques that can assist you in achieving a natural birth experience.  Talk to your health care provider to determine if you are a good candidate for a natural childbirth. This information is not intended to replace advice given to you by your health care provider. Make sure you discuss any questions you have with your health care provider. Document Revised: 08/13/2018 Document Reviewed: 06/30/2016 Elsevier Patient Education  2020 [employer]. Levonorgestrel intrauterine device (IUD) What is this medicine? LEVONORGESTREL IUD (LEE voe nor jes trel) is a contraceptive (birth control) device. The device is placed inside the uterus by a healthcare professional. It is used to prevent pregnancy. This device can also be used to treat heavy bleeding that occurs during your period. This medicine may be used for other purposes; ask your health care provider or pharmacist if you have questions. COMMON BRAND NAME(S): Cameron Ali What should I tell Telisha health care provider before I take this medicine? They need to know if you have any of these conditions:  abnormal Pap smear  cancer of the breast, uterus, or cervix  diabetes  endometritis  genital or pelvic infection now or in the past  have more than one sexual partner or your partner has more than one partner  heart disease  history of an ectopic or tubal pregnancy  immune system problems  IUD in place   liver disease or tumor  problems with blood clots or take blood-thinners  seizures  use intravenous drugs  uterus of unusual shape  vaginal bleeding that has not been explained  an unusual or allergic reaction to levonorgestrel, other hormones, silicone, or polyethylene, medicines, foods, dyes, or preservatives  pregnant or trying to get pregnant  breast-feeding How should I use this medicine? This device is placed inside the uterus by a health care professional. Talk to your pediatrician regarding the use of this medicine in children. Special care may be needed. Overdosage: If you think you have taken too much of this medicine contact a poison control center or emergency room at once. NOTE: This medicine is only for you. Do not share this medicine with others. What if I miss a dose? This does not apply. Depending on the brand of device you have inserted, the device will need to be replaced every 3 to 6 years if you wish to continue using this type of birth control. What may interact with this medicine? Do not take this medicine with any of the following medications:  amprenavir  bosentan  fosamprenavir This medicine may also interact with the following medications:  aprepitant  armodafinil  barbiturate medicines for inducing sleep or treating seizures  bexarotene  boceprevir  griseofulvin  medicines to treat seizures like carbamazepine, ethotoin, felbamate, oxcarbazepine, phenytoin, topiramate  modafinil  pioglitazone  rifabutin  rifampin  rifapentine  some medicines to treat HIV infection like atazanavir, efavirenz, indinavir, lopinavir, nelfinavir, tipranavir, ritonavir  St. John's wort  warfarin This list may not describe all possible interactions. Give your health care provider a list of all the medicines, herbs, non-prescription drugs, or dietary supplements you use. Also tell them if you smoke, drink alcohol, or use illegal drugs. Some items may  interact with your medicine. What should I watch for while using this medicine? Visit your doctor or health care professional for regular check ups. See your doctor if you or your partner has sexual contact with others, becomes HIV positive, or gets a sexual transmitted disease. This product does not protect you against HIV infection (AIDS) or other sexually transmitted diseases. You can check the placement of the IUD yourself by reaching up to the top of your vagina with clean fingers to feel the threads. Do not pull on the threads. It is a good habit to check placement after each menstrual period. Call your doctor right away if you feel more of the IUD than just the threads or if you cannot feel the threads at all. The IUD may come out by itself. You may become pregnant if the device comes out. If you notice that the IUD has come out use a backup birth control method like condoms and call your health care provider. Using tampons will not change the position of the IUD and are okay to use during your period. This IUD can be safely scanned with magnetic resonance imaging (MRI) only under specific conditions. Before you have an MRI, tell your healthcare provider that you have an IUD in place, and which type of IUD you have in place. What side effects may I notice from receiving this medicine? Side effects that you should report to your doctor or health care professional as soon as possible:  allergic reactions like skin rash, itching or hives, swelling of the face, lips, or tongue  fever, flu-like symptoms  genital sores  high blood pressure  no menstrual period for 6 weeks during use  pain, swelling, warmth in the leg  pelvic pain or tenderness  severe or sudden headache  signs of pregnancy  stomach cramping  sudden shortness of breath  trouble with balance, talking, or walking  unusual vaginal bleeding, discharge  yellowing of the eyes or skin Side effects that usually do not  require medical attention (report to your doctor or health care professional if they continue or are bothersome):  acne  breast pain  change in sex drive or performance  changes in weight  cramping, dizziness, or faintness while the device is being inserted  headache  irregular menstrual bleeding within first 3 to 6 months of use  nausea This list may not describe all possible side effects. Call your doctor for medical advice about side effects. You may report side effects to FDA at 1-800-FDA-1088. Where should I keep Tr[place]in medicine? This does not apply. NOTE: This sheet is a summary. It may not cover all possible information. If you have questions about this medicine, talk to your doctor, pharmacist, or health care provider.  2020 Elsevier/Gold Standard (2018-03-02 13:22:01)

## 2019-09-29 NOTE — Progress Notes (Signed)
Patient is complaining of a lot of discharge and some itching. Armandina Stammer RN

## 2019-09-30 LAB — CERVICOVAGINAL ANCILLARY ONLY
Bacterial Vaginitis (gardnerella): NEGATIVE
Candida Glabrata: NEGATIVE
Candida Vaginitis: POSITIVE — AB
Chlamydia: NEGATIVE
Comment: NEGATIVE
Comment: NEGATIVE
Comment: NEGATIVE
Comment: NEGATIVE
Comment: NORMAL
Neisseria Gonorrhea: NEGATIVE

## 2019-10-03 LAB — CULTURE, BETA STREP (GROUP B ONLY): Strep Gp B Culture: NEGATIVE

## 2019-10-04 ENCOUNTER — Ambulatory Visit: Payer: 59 | Attending: Obstetrics and Gynecology

## 2019-10-04 ENCOUNTER — Other Ambulatory Visit: Payer: Self-pay

## 2019-10-04 ENCOUNTER — Ambulatory Visit: Payer: 59 | Admitting: *Deleted

## 2019-10-04 DIAGNOSIS — Z3A36 36 weeks gestation of pregnancy: Secondary | ICD-10-CM

## 2019-10-04 DIAGNOSIS — O0993 Supervision of high risk pregnancy, unspecified, third trimester: Secondary | ICD-10-CM | POA: Diagnosis not present

## 2019-10-04 DIAGNOSIS — O24415 Gestational diabetes mellitus in pregnancy, controlled by oral hypoglycemic drugs: Secondary | ICD-10-CM | POA: Diagnosis not present

## 2019-10-04 DIAGNOSIS — O2240 Hemorrhoids in pregnancy, unspecified trimester: Secondary | ICD-10-CM | POA: Diagnosis not present

## 2019-10-04 DIAGNOSIS — R7303 Prediabetes: Secondary | ICD-10-CM | POA: Diagnosis not present

## 2019-10-07 ENCOUNTER — Ambulatory Visit (INDEPENDENT_AMBULATORY_CARE_PROVIDER_SITE_OTHER): Payer: 59 | Admitting: Obstetrics & Gynecology

## 2019-10-07 ENCOUNTER — Other Ambulatory Visit: Payer: Self-pay

## 2019-10-07 ENCOUNTER — Encounter: Payer: Self-pay | Admitting: Obstetrics & Gynecology

## 2019-10-07 VITALS — BP 111/58 | HR 93 | Wt 223.1 lb

## 2019-10-07 DIAGNOSIS — O0993 Supervision of high risk pregnancy, unspecified, third trimester: Secondary | ICD-10-CM

## 2019-10-07 DIAGNOSIS — U071 COVID-19: Secondary | ICD-10-CM

## 2019-10-07 DIAGNOSIS — O2240 Hemorrhoids in pregnancy, unspecified trimester: Secondary | ICD-10-CM

## 2019-10-07 DIAGNOSIS — G5603 Carpal tunnel syndrome, bilateral upper limbs: Secondary | ICD-10-CM

## 2019-10-07 DIAGNOSIS — Z3A37 37 weeks gestation of pregnancy: Secondary | ICD-10-CM

## 2019-10-07 DIAGNOSIS — O98512 Other viral diseases complicating pregnancy, second trimester: Secondary | ICD-10-CM

## 2019-10-07 DIAGNOSIS — O24415 Gestational diabetes mellitus in pregnancy, controlled by oral hypoglycemic drugs: Secondary | ICD-10-CM

## 2019-10-07 DIAGNOSIS — R7303 Prediabetes: Secondary | ICD-10-CM

## 2019-10-07 NOTE — Patient Instructions (Signed)

## 2019-10-07 NOTE — Progress Notes (Signed)
° °  PRENATAL VISIT NOTE  Subjective:  Brandi Russo is a 34 y.o. G2P1001 at [redacted]w[redacted]d being seen today for ongoing prenatal care.  She is currently monitored for the following issues for this high-risk pregnancy and has Supervision of high risk pregnancy in third trimester; Prediabetes; COVID-19 affecting pregnancy in second trimester; Gestational diabetes mellitus (GDM) in third trimester controlled on oral hypoglycemic drug; Hemorrhoids during pregnancy; and Bilateral carpal tunnel syndrome on their problem list.  Patient reports pelvic pressure.  Contractions: Irregular. Vag. Bleeding: None.  Movement: Present. Denies leaking of fluid.   The following portions of the patient's history were reviewed and updated as appropriate: allergies, current medications, past family history, past medical history, past social history, past surgical history and problem list.   Objective:   Vitals:   10/07/19 0816  BP: (!) 111/58  Pulse: 93  Weight: 223 lb 1.9 oz (101.2 kg)    Fetal Status: Fetal Heart Rate (bpm): 137   Movement: Present     General:  Alert, oriented and cooperative. Patient is in no acute distress.  Skin: Skin is warm and dry. No rash noted.   Cardiovascular: Normal heart rate noted  Respiratory: Normal respiratory effort, no problems with respiration noted  Abdomen: Soft, gravid, appropriate for gestational age.  Pain/Pressure: Present     Pelvic: Cervical exam performed in the presence of a chaperone        Extremities: Normal range of motion.  Edema: None  Mental Status: Normal mood and affect. Normal behavior. Normal judgment and thought content.   Assessment and Plan:  Pregnancy: G2P1001 at [redacted]w[redacted]d 1. Supervision of high risk pregnancy in third trimester FHR WNL  2. Gestational diabetes mellitus (GDM) in third trimester controlled on oral hypoglycemic drug Fasting and 2 hour improved since increase in Metformin. All readings WNL.   3. Hemorrhoids during pregnancy,  antepartum  4. Prediabetes  5. COVID-19 affecting pregnancy in second trimester  6. Bilateral carpal tunnel syndrome   Term labor symptoms and general obstetric precautions including but not limited to vaginal bleeding, contractions, leaking of fluid and fetal movement were reviewed in detail with the patient. Please refer to After Visit Summary for other counseling recommendations.   No follow-ups on file.  Future Appointments  Date Time Provider Department Center  10/12/2019  3:30 PM [facility] NURSE [facility]  10/12/2019  3:30 PM [facility] US5 [facility]  10/13/2019  4:15 PM Levie Heritage, DO [facility] None  10/20/2019  8:45 AM Adrian Blackwater, Rhona Raider, DO [facility] None    Willodean Rosenthal, MD

## 2019-10-11 ENCOUNTER — Telehealth (HOSPITAL_COMMUNITY): Payer: Self-pay | Admitting: *Deleted

## 2019-10-11 NOTE — Telephone Encounter (Signed)
Preadmission screen  

## 2019-10-12 ENCOUNTER — Telehealth (HOSPITAL_COMMUNITY): Payer: Self-pay | Admitting: *Deleted

## 2019-10-12 ENCOUNTER — Other Ambulatory Visit: Payer: Self-pay

## 2019-10-12 ENCOUNTER — Other Ambulatory Visit (HOSPITAL_COMMUNITY): Payer: Self-pay | Admitting: Advanced Practice Midwife

## 2019-10-12 ENCOUNTER — Other Ambulatory Visit: Payer: Self-pay | Admitting: Advanced Practice Midwife

## 2019-10-12 ENCOUNTER — Ambulatory Visit: Payer: 59 | Admitting: *Deleted

## 2019-10-12 ENCOUNTER — Encounter (HOSPITAL_COMMUNITY): Payer: Self-pay | Admitting: *Deleted

## 2019-10-12 ENCOUNTER — Ambulatory Visit: Payer: 59 | Attending: Obstetrics and Gynecology

## 2019-10-12 DIAGNOSIS — O0993 Supervision of high risk pregnancy, unspecified, third trimester: Secondary | ICD-10-CM | POA: Diagnosis not present

## 2019-10-12 DIAGNOSIS — O24415 Gestational diabetes mellitus in pregnancy, controlled by oral hypoglycemic drugs: Secondary | ICD-10-CM | POA: Insufficient documentation

## 2019-10-12 DIAGNOSIS — Z3A38 38 weeks gestation of pregnancy: Secondary | ICD-10-CM | POA: Diagnosis not present

## 2019-10-12 DIAGNOSIS — R7303 Prediabetes: Secondary | ICD-10-CM | POA: Diagnosis not present

## 2019-10-12 DIAGNOSIS — Z362 Encounter for other antenatal screening follow-up: Secondary | ICD-10-CM | POA: Diagnosis not present

## 2019-10-12 DIAGNOSIS — O2240 Hemorrhoids in pregnancy, unspecified trimester: Secondary | ICD-10-CM

## 2019-10-12 DIAGNOSIS — O368130 Decreased fetal movements, third trimester, not applicable or unspecified: Secondary | ICD-10-CM | POA: Diagnosis not present

## 2019-10-12 NOTE — Telephone Encounter (Signed)
Preadmission screen  

## 2019-10-13 ENCOUNTER — Ambulatory Visit (INDEPENDENT_AMBULATORY_CARE_PROVIDER_SITE_OTHER): Payer: 59 | Admitting: Family Medicine

## 2019-10-13 VITALS — BP 125/74 | HR 96 | Wt 227.0 lb

## 2019-10-13 DIAGNOSIS — O24415 Gestational diabetes mellitus in pregnancy, controlled by oral hypoglycemic drugs: Secondary | ICD-10-CM

## 2019-10-13 DIAGNOSIS — Z3A38 38 weeks gestation of pregnancy: Secondary | ICD-10-CM

## 2019-10-13 DIAGNOSIS — K219 Gastro-esophageal reflux disease without esophagitis: Secondary | ICD-10-CM

## 2019-10-13 DIAGNOSIS — O0993 Supervision of high risk pregnancy, unspecified, third trimester: Secondary | ICD-10-CM

## 2019-10-13 DIAGNOSIS — O99613 Diseases of the digestive system complicating pregnancy, third trimester: Secondary | ICD-10-CM

## 2019-10-13 NOTE — Progress Notes (Signed)
   PRENATAL VISIT NOTE  Subjective:  Brandi Russo is a 34 y.o. G2P1001 at [redacted]w[redacted]d being seen today for ongoing prenatal care.  She is currently monitored for the following issues for this high-risk pregnancy and has Supervision of high risk pregnancy in third trimester; Prediabetes; COVID-19 affecting pregnancy in second trimester; Gestational diabetes mellitus (GDM) in third trimester controlled on oral hypoglycemic drug; Hemorrhoids during pregnancy; and Bilateral carpal tunnel syndrome on their problem list.  Patient reports occasional contractions.  Contractions: Irritability. Vag. Bleeding: None.  Movement: Present. Denies leaking of fluid.   The following portions of the patient's history were reviewed and updated as appropriate: allergies, current medications, past family history, past medical history, past social history, past surgical history and problem list.   Objective:   Vitals:   10/13/19 1621  BP: 125/74  Pulse: 96  Weight: 227 lb (103 kg)    Fetal Status: Fetal Heart Rate (bpm): 155   Movement: Present     General:  Alert, oriented and cooperative. Patient is in no acute distress.  Skin: Skin is warm and dry. No rash noted.   Cardiovascular: Normal heart rate noted  Respiratory: Normal respiratory effort, no problems with respiration noted  Abdomen: Soft, gravid, appropriate for gestational age.  Pain/Pressure: Present     Pelvic: Cervical exam deferred        Extremities: Normal range of motion.  Edema: Trace  Mental Status: Normal mood and affect. Normal behavior. Normal judgment and thought content.   Assessment and Plan:  Pregnancy: G2P1001 at [redacted]w[redacted]d  1. Supervision of high risk pregnancy in third trimester FHT and FH normal  2. Gestational diabetes mellitus (GDM) in third trimester controlled on oral hypoglycemic drug Induction scheduled for 39 weeks Will write out of work due to increasing swelling and pain in legs. CBgs controlled  3. Gastroesophageal  reflux disease without esophagitis   Preterm labor symptoms and general obstetric precautions including but not limited to vaginal bleeding, contractions, leaking of fluid and fetal movement were reviewed in detail with the patient. Please refer to After Visit Summary for other counseling recommendations.   No follow-ups on file.  Future Appointments  Date Time Provider Department Center  10/17/2019  9:40 AM [facility]-SCREENING [facility] None  10/19/2019 12:00 AM [facility] SCHED ROOM [facility] None  11/17/2019  8:30 AM Levie Heritage, DO [facility] None    Levie Heritage, DO

## 2019-10-17 ENCOUNTER — Other Ambulatory Visit (HOSPITAL_COMMUNITY)
Admission: RE | Admit: 2019-10-17 | Discharge: 2019-10-17 | Disposition: A | Discharge status: Home / Self Care | Payer: 59 | Source: Ambulatory Visit | Attending: Family Medicine | Admitting: Family Medicine

## 2019-10-17 LAB — SARS CORONAVIRUS 2 (TAT 6-24 HRS): SARS Coronavirus 2: NEGATIVE

## 2019-10-18 ENCOUNTER — Inpatient Hospital Stay (HOSPITAL_COMMUNITY)
Admission: AD | Admit: 2019-10-18 | Discharge: 2019-10-20 | DRG: 806 | Disposition: A | Discharge status: Home / Self Care | Payer: 59 | Attending: Obstetrics & Gynecology | Admitting: Obstetrics & Gynecology

## 2019-10-18 ENCOUNTER — Telehealth: Payer: Self-pay

## 2019-10-18 ENCOUNTER — Encounter (HOSPITAL_COMMUNITY): Payer: Self-pay | Admitting: Obstetrics & Gynecology

## 2019-10-18 ENCOUNTER — Other Ambulatory Visit: Payer: Self-pay

## 2019-10-18 DIAGNOSIS — O2243 Hemorrhoids in pregnancy, third trimester: Secondary | ICD-10-CM | POA: Diagnosis present

## 2019-10-18 DIAGNOSIS — O99354 Diseases of the nervous system complicating childbirth: Secondary | ICD-10-CM | POA: Diagnosis present

## 2019-10-18 DIAGNOSIS — Z3A38 38 weeks gestation of pregnancy: Secondary | ICD-10-CM

## 2019-10-18 DIAGNOSIS — O24425 Gestational diabetes mellitus in childbirth, controlled by oral hypoglycemic drugs: Secondary | ICD-10-CM | POA: Diagnosis present

## 2019-10-18 DIAGNOSIS — Z20822 Contact with and (suspected) exposure to covid-19: Secondary | ICD-10-CM | POA: Diagnosis present

## 2019-10-18 DIAGNOSIS — O98512 Other viral diseases complicating pregnancy, second trimester: Secondary | ICD-10-CM | POA: Diagnosis present

## 2019-10-18 DIAGNOSIS — O0993 Supervision of high risk pregnancy, unspecified, third trimester: Secondary | ICD-10-CM

## 2019-10-18 DIAGNOSIS — R7303 Prediabetes: Secondary | ICD-10-CM | POA: Diagnosis present

## 2019-10-18 DIAGNOSIS — O2240 Hemorrhoids in pregnancy, unspecified trimester: Secondary | ICD-10-CM | POA: Diagnosis present

## 2019-10-18 DIAGNOSIS — Z8616 Personal history of COVID-19: Secondary | ICD-10-CM

## 2019-10-18 DIAGNOSIS — O4202 Full-term premature rupture of membranes, onset of labor within 24 hours of rupture: Secondary | ICD-10-CM | POA: Diagnosis not present

## 2019-10-18 DIAGNOSIS — O24415 Gestational diabetes mellitus in pregnancy, controlled by oral hypoglycemic drugs: Secondary | ICD-10-CM

## 2019-10-18 DIAGNOSIS — O26893 Other specified pregnancy related conditions, third trimester: Secondary | ICD-10-CM | POA: Diagnosis present

## 2019-10-18 DIAGNOSIS — G5603 Carpal tunnel syndrome, bilateral upper limbs: Secondary | ICD-10-CM | POA: Diagnosis present

## 2019-10-18 LAB — CBC
HCT: 40 % (ref 36.0–46.0)
Hemoglobin: 12.9 g/dL (ref 12.0–15.0)
MCH: 26 pg (ref 26.0–34.0)
MCHC: 32.3 g/dL (ref 30.0–36.0)
MCV: 80.5 fL (ref 80.0–100.0)
Platelets: 242 10*3/uL (ref 150–400)
RBC: 4.97 MIL/uL (ref 3.87–5.11)
RDW: 14.3 % (ref 11.5–15.5)
WBC: 17.5 10*3/uL — ABNORMAL HIGH (ref 4.0–10.5)
nRBC: 0 % (ref 0.0–0.2)

## 2019-10-18 LAB — POCT FERN TEST: POCT Fern Test: POSITIVE

## 2019-10-18 LAB — TYPE AND SCREEN
ABO/RH(D): B POS
Antibody Screen: NEGATIVE

## 2019-10-18 LAB — ABO/RH: ABO/RH(D): B POS

## 2019-10-18 LAB — GLUCOSE, CAPILLARY: Glucose-Capillary: 108 mg/dL — ABNORMAL HIGH (ref 70–99)

## 2019-10-18 MED ORDER — OXYCODONE-ACETAMINOPHEN 5-325 MG PO TABS: 2.0000 | ORAL_TABLET | ORAL | Status: DC | PRN

## 2019-10-18 MED ORDER — OXYTOCIN-SODIUM CHLORIDE 30-0.9 UT/500ML-% IV SOLN
INTRAVENOUS | Status: AC
  Filled 2019-10-18: qty 1000

## 2019-10-18 MED ORDER — OXYCODONE HCL 5 MG PO TABS: 10.0000 mg | ORAL_TABLET | ORAL | Status: DC | PRN

## 2019-10-18 MED ORDER — EPHEDRINE 5 MG/ML INJ: 10.0000 mg | INTRAVENOUS | Status: DC | PRN

## 2019-10-18 MED ORDER — SIMETHICONE 80 MG PO CHEW: 80.0000 mg | CHEWABLE_TABLET | ORAL | Status: DC | PRN

## 2019-10-18 MED ORDER — WITCH HAZEL-GLYCERIN EX PADS: 1.0000 "application " | MEDICATED_PAD | CUTANEOUS | Status: DC | PRN

## 2019-10-18 MED ORDER — LACTATED RINGERS IV SOLN: INTRAVENOUS | Status: DC

## 2019-10-18 MED ORDER — PHENYLEPHRINE 40 MCG/ML (10ML) SYRINGE FOR IV PUSH (FOR BLOOD PRESSURE SUPPORT): 80.0000 ug | PREFILLED_SYRINGE | INTRAVENOUS | Status: DC | PRN

## 2019-10-18 MED ORDER — FENTANYL CITRATE (PF) 100 MCG/2ML IJ SOLN: 100.0000 ug | Freq: Once | INTRAMUSCULAR | Status: DC

## 2019-10-18 MED ORDER — MAGNESIUM HYDROXIDE 400 MG/5ML PO SUSP: 30.0000 mL | ORAL | Status: DC | PRN

## 2019-10-18 MED ORDER — ACETAMINOPHEN 325 MG PO TABS
650.0000 mg | ORAL_TABLET | ORAL | Status: DC | PRN
  Administered 2019-10-18: 650 mg via ORAL
  Filled 2019-10-18: qty 2

## 2019-10-18 MED ORDER — OXYTOCIN-SODIUM CHLORIDE 30-0.9 UT/500ML-% IV SOLN: 2.5000 [IU]/h | INTRAVENOUS | Status: DC

## 2019-10-18 MED ORDER — SENNOSIDES-DOCUSATE SODIUM 8.6-50 MG PO TABS
2.0000 | ORAL_TABLET | ORAL | Status: DC
  Administered 2019-10-18 – 2019-10-19 (×2): 2 via ORAL
  Filled 2019-10-18 (×2): qty 2

## 2019-10-18 MED ORDER — DIBUCAINE (PERIANAL) 1 % EX OINT: 1.0000 "application " | TOPICAL_OINTMENT | CUTANEOUS | Status: DC | PRN

## 2019-10-18 MED ORDER — LACTATED RINGERS IV SOLN: 500.0000 mL | INTRAVENOUS | Status: DC | PRN

## 2019-10-18 MED ORDER — FLEET ENEMA 7-19 GM/118ML RE ENEM: 1.0000 | ENEMA | RECTAL | Status: DC | PRN

## 2019-10-18 MED ORDER — LACTATED RINGERS IV SOLN: 500.0000 mL | Freq: Once | INTRAVENOUS | Status: DC

## 2019-10-18 MED ORDER — FENTANYL CITRATE (PF) 100 MCG/2ML IJ SOLN
INTRAMUSCULAR | Status: AC
  Filled 2019-10-18: qty 2

## 2019-10-18 MED ORDER — FENTANYL-BUPIVACAINE-NACL 0.5-0.125-0.9 MG/250ML-% EP SOLN: 12.0000 mL/h | EPIDURAL | Status: DC | PRN

## 2019-10-18 MED ORDER — IBUPROFEN 600 MG PO TABS
600.0000 mg | ORAL_TABLET | Freq: Four times a day (QID) | ORAL | Status: DC
  Administered 2019-10-18 – 2019-10-20 (×7): 600 mg via ORAL
  Filled 2019-10-18 (×7): qty 1

## 2019-10-18 MED ORDER — DIPHENHYDRAMINE HCL 25 MG PO CAPS: 25.0000 mg | ORAL_CAPSULE | Freq: Four times a day (QID) | ORAL | Status: DC | PRN

## 2019-10-18 MED ORDER — ACETAMINOPHEN 325 MG PO TABS: 650.0000 mg | ORAL_TABLET | ORAL | Status: DC | PRN

## 2019-10-18 MED ORDER — OXYCODONE HCL 5 MG PO TABS: 5.0000 mg | ORAL_TABLET | ORAL | Status: DC | PRN

## 2019-10-18 MED ORDER — BENZOCAINE-MENTHOL 20-0.5 % EX AERO: 1.0000 "application " | INHALATION_SPRAY | CUTANEOUS | Status: DC | PRN

## 2019-10-18 MED ORDER — LIDOCAINE HCL (PF) 1 % IJ SOLN: 30.0000 mL | INTRAMUSCULAR | Status: DC | PRN

## 2019-10-18 MED ORDER — OXYCODONE-ACETAMINOPHEN 5-325 MG PO TABS: 1.0000 | ORAL_TABLET | ORAL | Status: DC | PRN

## 2019-10-18 MED ORDER — SOD CITRATE-CITRIC ACID 500-334 MG/5ML PO SOLN: 30.0000 mL | ORAL | Status: DC | PRN

## 2019-10-18 MED ORDER — ONDANSETRON HCL 4 MG/2ML IJ SOLN: 4.0000 mg | Freq: Four times a day (QID) | INTRAMUSCULAR | Status: DC | PRN

## 2019-10-18 MED ORDER — PRENATAL MULTIVITAMIN CH
1.0000 | ORAL_TABLET | Freq: Every day | ORAL | Status: DC
  Administered 2019-10-18 – 2019-10-19 (×2): 1 via ORAL
  Filled 2019-10-18 (×2): qty 1

## 2019-10-18 MED ORDER — ONDANSETRON HCL 4 MG PO TABS: 4.0000 mg | ORAL_TABLET | ORAL | Status: DC | PRN

## 2019-10-18 MED ORDER — DIPHENHYDRAMINE HCL 50 MG/ML IJ SOLN: 12.5000 mg | INTRAMUSCULAR | Status: DC | PRN

## 2019-10-18 MED ORDER — COCONUT OIL OIL
1.0000 "application " | TOPICAL_OIL | Status: DC | PRN
  Administered 2019-10-19: 1 via TOPICAL

## 2019-10-18 MED ORDER — ONDANSETRON HCL 4 MG/2ML IJ SOLN: 4.0000 mg | INTRAMUSCULAR | Status: DC | PRN

## 2019-10-18 MED ORDER — OXYTOCIN BOLUS FROM INFUSION
333.0000 mL | Freq: Once | INTRAVENOUS | Status: AC
  Administered 2019-10-18: 333 mL via INTRAVENOUS

## 2019-10-18 NOTE — MAU Note (Signed)
Presents with ctxs every 1.5-2 minutes apart.  Denies VB, but reports LOF since 0845 this morning, fluid clear. Unsure if any FM.

## 2019-10-18 NOTE — Discharge Summary (Addendum)
Postpartum Discharge Summary     Patient Name: Brandi Russo DOB: 12/19/1985 MRN: [id number]  Date of admission: 10/18/2019 Delivery date:10/18/2019  Delivering provider:   Date of discharge: 10/20/2019  Admitting diagnosis: Normal labor [O80, Z37.9] Intrauterine pregnancy: [redacted]w[redacted]d    Secondary diagnosis:  Active Problems:   Supervision of high risk pregnancy in third trimester   Prediabetes   COVID-19 affecting pregnancy in second trimester   Gestational diabetes mellitus (GDM) in third trimester controlled on oral hypoglycemic drug   Hemorrhoids during pregnancy   Bilateral carpal tunnel syndrome  Additional problems: None    Discharge diagnosis: Term Pregnancy Delivered and GDM A2                                              Post partum procedures:None Augmentation: N/A Complications: None  Hospital course: Onset of Labor With Vaginal Delivery      34y.o. yo G2P1001 at 330w6das admitted in Active Labor on 10/18/2019. Patient had an uncomplicated labor course as follows: arrived to MAU 5cm after SROM for clear at home several hours prior, rapidly progressed to precipitous NSVD. Membrane Rupture Time/Date: 8:40 AM ,10/18/2019   Delivery Method:Vaginal, Spontaneous  Episiotomy: None  Lacerations:  None  Delivery notable for shoulder dystocia lasting 1 min 30 sec resolved w McRoberts and suprapubic pressure. Patient had an uncomplicated postpartum course. Fasting CBG was 90. She is ambulating, tolerating a regular diet, passing flatus, and urinating well. Patient is discharged home in stable condition on 10/20/19.  Newborn Data: Birth date:10/18/2019  Birth time:12:02 PM  Gender:Female  Living status:Living  Apgars:4 ,9  Weight:3785 g   Magnesium Sulfate received: No BMZ received: No Rhophylac:N/A MMR:N/A T-DaP:Given prenatally Flu: Yes Transfusion:No  Physical exam  Vitals:   10/18/19 1056 10/18/19 1208 10/18/19 1218  BP: 139/71 137/72 (!) 131/52  Pulse: 85 90 87   Resp: 20    Temp: 98.1 F (36.7 C)    TempSrc: Oral    SpO2: 99%    Weight: 104.1 kg    Height: 5' 4"  (1.626 m)     General: alert, cooperative and no distress Lochia: appropriate Uterine Fundus: firm DVT Evaluation: No evidence of DVT seen on physical exam.  Labs: Lab Results  Component Value Date   WBC 8.2 08/09/2019   HGB 11.9 08/09/2019   HCT 36.4 08/09/2019   MCV 87 08/09/2019   PLT 210 08/09/2019   No flowsheet data found. Edinburgh Score: No flowsheet data found.   After visit meds:  Allergies as of 10/20/2019   No Known Allergies     Medication List    STOP taking these medications   blood glucose meter kit and supplies   freestyle lancets   glucose blood test strip   metFORMIN 500 MG tablet Commonly known as: Glucophage     TAKE these medications   ibuprofen 600 MG tablet Commonly known as: ADVIL Take 1 tablet (600 mg total) by mouth every 6 (six) hours.   pantoprazole 20 MG tablet Commonly known as: PROTONIX TAKE 1 TABLET (20 MG TOTAL) BY MOUTH DAILY.   PRENATAL VITAMINS PO Take by mouth.   terconazole 0.4 % vaginal cream Commonly known as: TERAZOL 7 Place 1 applicator vaginally at bedtime. Use for seven days        Discharge home in stable condition Infant Feeding: Breast Infant Disposition:home with  mother Discharge instruction: per After Visit Summary and Postpartum booklet. Activity: Advance as tolerated. Pelvic rest for 6 weeks.  Diet: carb modified diet Future Appointments: Future Appointments  Date Time Provider [place]  11/17/2019  8:30 AM Truett Mainland, DO [facility] None   Follow up Visit:   Please schedule this patient for a In person postpartum visit in 6 weeks with the following provider: Any provider. Additional Postpartum F/U:2 hour GTT  High risk pregnancy complicated by: GDM Delivery mode:  Vaginal, Spontaneous  Anticipated Birth Control:  IUD at postpartum visit  EMILY Madelin Headings, MD PGY-2  Resident Family Medicine 10/20/2019, 8:07 AM  CNM attestation I have seen and examined this patient and agree with above documentation in the resident's note.   Sreenidhi Adler is a 34 y.o. [id number] s/p vag del with 1.28mn SD.   Pain is well controlled.  Plan for birth control is unsure.  Method of Feeding: breast  PE:  BP 123/66 (BP Location: Right Arm)    Pulse 78    Temp 98 F (36.7 C) (Oral)    Resp 20    Ht 5' 4"  (1.626 m)    Wt 104.1 kg    LMP 01/19/2019 (Exact Date)    SpO2 100%    Breastfeeding Unknown    BMI 39.38 kg/m  Fundus firm  No results for input(s): HGB, HCT in the last 72 hours.   Plan: discharge today - postpartum care discussed - f/u clinic in 4 weeks for postpartum visit with GTT   KMyrtis Ser CNM 9:54 PM

## 2019-10-18 NOTE — Telephone Encounter (Signed)
Pt called stating she is scheduled to be induced tonight but her water just broke. Advised pt to go to MAU at [facility]. Understanding was voiced.  Delories Mauri l Cruz Devilla, CMA

## 2019-10-18 NOTE — H&P (Addendum)
LABOR AND DELIVERY ADMISSION HISTORY AND PHYSICAL NOTE  Brandi Russo is a 34 y.o. female G2P1001 with IUP at 26w6dby LMP presenting for SROM/SOL.  She reports positive fetal movement. She denies leakage of fluid or vaginal bleeding.  Prenatal History/Complications: PNC at [facility] Pregnancy complications:  - Prediabetes with GDMA2 (Metformin) - COVID-19 in second trimester  Past Medical History: Past Medical History:  Diagnosis Date  . ADHD   . Gestational diabetes   . History of gestational diabetes     Past Surgical History: Past Surgical History:  Procedure Laterality Date  . NO PAST SURGERIES      Obstetrical History: OB History    Gravida  2   Para  1   Term  1   Preterm      AB      Living  1     SAB      TAB      Ectopic      Multiple      Live Births  1           Social History: Social History   Socioeconomic History  . Marital status: Married    Spouse name: Not on file  . Number of children: Not on file  . Years of education: Not on file  . Highest education level: Not on file  Occupational History  . Not on file  Tobacco Use  . Smoking status: Never Smoker  . Smokeless tobacco: Never Used  Vaping Use  . Vaping Use: Never used  Substance and Sexual Activity  . Alcohol use: Never  . Drug use: Never  . Sexual activity: Yes    Birth control/protection: None  Other Topics Concern  . Not on file  Social History Narrative  . Not on file   Social Determinants of Health   Financial Resource Strain:   . Difficulty of Paying Living Expenses:   Food Insecurity:   . Worried About RCharity fundraiserin the Last Year:   . RArboriculturistin the Last Year:   Transportation Needs:   . LFilm/video editor(Medical):   .Marland KitchenLack of Transportation (Non-Medical):   Physical Activity:   . Days of Exercise per Week:   . Minutes of Exercise per Session:   Stress:   . Feeling of Stress :   Social Connections:   . Frequency of  Communication with Friends and Family:   . Frequency of Social Gatherings with Friends and Family:   . Attends Religious Services:   . Active Member of Clubs or Organizations:   . Attends CArchivistMeetings:   .Marland KitchenMarital Status:     Family History: Family History  Problem Relation Age of Onset  . Hypertension Mother   . Hypertension Father   . Lung cancer Maternal Grandmother     Allergies: No Known Allergies  Medications Prior to Admission  Medication Sig Dispense Refill Last Dose  . metFORMIN (GLUCOPHAGE) 500 MG tablet Take one tablet in the am and 2 by mouth at bedtime 60 tablet 2 10/17/2019 at Unknown time  . pantoprazole (PROTONIX) 20 MG tablet TAKE 1 TABLET (20 MG TOTAL) BY MOUTH DAILY. 60 tablet 1 Past Week at Unknown time  . Prenatal Vit-Fe Fumarate-FA (PRENATAL VITAMINS PO) Take by mouth.   10/17/2019 at Unknown time  . terconazole (TERAZOL 7) 0.4 % vaginal cream Place 1 applicator vaginally at bedtime. Use for seven days 45 g 0 Past Month at Unknown  time  . blood glucose meter kit and supplies Dispense abbott Freestyle meter. Use up to four times daily as directed. (FOR ICD-10 E10.9, E11.9). 1 each 0   . glucose blood test strip Test stripes for abbott freestyle meter. Used for testing blood glucose four times a day. 100 each 12   . Lancets (FREESTYLE) lancets Use as instructed- four times a day for testing blood glucose. 100 each 12      Review of Systems  All systems reviewed and negative except as stated in HPI  Physical Exam Blood pressure 139/71, pulse 85, temperature 98.1 F (36.7 C), temperature source Oral, resp. rate 20, height 5' 4" (1.626 m), weight 104.1 kg, last menstrual period 01/19/2019, SpO2 99 %. General appearance: alert, oriented, uncomfortable with contractions Lungs: normal respiratory effort Heart: regular rate Abdomen: soft, non-tender; gravid, FH appropriate for GA Extremities: No calf swelling or tenderness Presentation:  cephalic Fetal monitoring: 125/moderate/15x15 accels/no decels Uterine activity: Contractions q2 min Dilation: 5 Effacement (%): 80 Station: -2 Exam by:: H.Price, RN  Prenatal labs: ABO, Rh: B/Positive/-- (12/02 1731) Antibody: Negative (12/02 1731) Rubella: 6.07 (12/02 1731) RPR: Non Reactive (04/06 0846)  HBsAg: Negative (12/02 1731)  HIV: Non Reactive (04/06 0846)  GC/Chlamydia: Negative GBS: Negative/-- (05/27 0828)  2-hr GTT: Failed, GDMA2 Genetic screening:  NIPS low risk female; AFP negative Anatomy US: Normal female.  EFW 3600 g (7 lb 15 oz), 81%ile on 10/12/2019 [place]  Prenatal Transfer Tool  Maternal Diabetes: Yes:  Diabetes Type:  Insulin/Medication controlled Genetic Screening: Normal Maternal Ultrasounds/Referrals: Normal Fetal Ultrasounds or other Referrals:  Referred to Materal Fetal Medicine  Maternal Substance Abuse:  No Significant Maternal Medications:  Meds include: Other: Metformin Significant Maternal Lab Results: Group B Strep negative  Results for orders placed or performed during the hospital encounter of 10/17/19 (from the past 24 hour(s))  SARS CORONAVIRUS 2 (TAT 6-24 HRS) Nasopharyngeal Nasopharyngeal Swab   Collection Time: 10/17/19  3:28 PM   Specimen: Nasopharyngeal Swab  Result Value Ref Range   SARS Coronavirus 2 NEGATIVE NEGATIVE    Patient Active Problem List   Diagnosis Date Noted  . Hemorrhoids during pregnancy 07/14/2019  . Bilateral carpal tunnel syndrome 06/02/2019  . COVID-19 affecting pregnancy in second trimester 05/10/2019  . Gestational diabetes mellitus (GDM) in third trimester controlled on oral hypoglycemic drug 05/10/2019  . Prediabetes 04/14/2019  . Supervision of high risk pregnancy in third trimester 04/06/2019    Assessment: Brandi Russo is a 34 y.o. G2P1001 at 22w6dhere for SROM/SOL.  #Labor: Expectant management, expect NSVD #Pain: Desires epidural #FWB: Cat I, EFW 3600 g (7 lb 15 oz), 81%ile on 10/12/2019 [place]#ID:   GBS negative #MOF: Breast #MOC:Undecided #GDMA2: CBG q2h during labor   EMILY MMadelin Headings MD PGY-2 Resident Family Medicine 10/18/2019, 11:21 AM  OB FELLOW ATTESTATION  I have seen and examined this patient and edited the above documentation in the resident's note to reflect any changes or updates.  MAugustin Coupe MD/[facility] OB Fellow  10/18/2019, 12:28 PM

## 2019-10-19 ENCOUNTER — Inpatient Hospital Stay (HOSPITAL_COMMUNITY): Payer: 59

## 2019-10-19 ENCOUNTER — Inpatient Hospital Stay (HOSPITAL_COMMUNITY): Admission: AD | Admit: 2019-10-19 | Payer: 59 | Source: Home / Self Care | Admitting: Family Medicine

## 2019-10-19 LAB — RPR: RPR Ser Ql: NONREACTIVE

## 2019-10-19 LAB — GLUCOSE, CAPILLARY: Glucose-Capillary: 155 mg/dL — ABNORMAL HIGH (ref 70–99)

## 2019-10-19 NOTE — Progress Notes (Signed)
Post Partum Day 1 Subjective: no complaints, up ad lib, voiding and tolerating PO  Objective: Blood pressure 126/74, pulse 85, temperature 98 F (36.7 C), temperature source Oral, resp. rate 17, height 5\' 4"  (1.626 m), weight 104.1 kg, last menstrual period 01/19/2019, SpO2 99 %, unknown if currently breastfeeding.  Physical Exam:  General: alert, cooperative and no distress Lochia: appropriate Uterine Fundus: firm DVT Evaluation: No evidence of DVT seen on physical exam. Negative Homan's sign. No cords or calf tenderness.  Recent Labs    10/18/19 1251  HGB 12.9  HCT 40.0    Assessment/Plan: Plan for discharge tomorrow and Breastfeeding.    LOS: 1 day   10/20/19 10/19/2019, 7:43 AM

## 2019-10-20 ENCOUNTER — Encounter: Payer: 59 | Admitting: Family Medicine

## 2019-10-20 LAB — GLUCOSE, CAPILLARY: Glucose-Capillary: 90 mg/dL (ref 70–99)

## 2019-10-20 MED ORDER — IBUPROFEN 600 MG PO TABS: 600.0000 mg | ORAL_TABLET | Freq: Four times a day (QID) | ORAL | 0 refills | Status: DC

## 2019-10-20 MED FILL — IBUPROFEN 600 MG TABLET: 600 | 7 days supply | Qty: 30 | Fill #0

## 2019-10-20 NOTE — Lactation Note (Signed)
This note was copied from a baby's chart. Lactation Consultation Note RN stated mom would like to see LC. LC attempted to see mom, mom sleeping. Will try again.  Patient Name: Brandi Russo Today's Date: 10/20/2019     Maternal Data    Feeding    LATCH Score                   Interventions    Lactation Tools Discussed/Used     Consult Status      Brandi Russo 10/20/2019, 3:17 AM

## 2019-10-20 NOTE — Lactation Note (Signed)
This note was copied from a baby's chart. Lactation Consultation Note  Patient Name: Brandi Russo TKPTW'S Date: 10/20/2019 Reason for consult: Follow-up assessment;Early term 37-38.6wks;Nipple pain/trauma  [id number] - I conducted an initial and a discharge visit with Brandi Russo. She is a P2, who breast fed her son previously. She states that she had difficulty with her son and that she did quite a lot of pumping. She states that she did not think she needed lactation initially because baby "Brandi Russo" was latching well; however, she has now becoming sore, and she wants a latch check.  RN was in room checking baby at this time. When she finished, Brandi Russo was cueing. I assisted with latching baby on the left breast in cross cradle hold. I instructed Brandi Russo to use the "U" hold to grasp her breast. Baby latched readily. I noted that baby tended to keep lower lip tucked. I showed both parents how to check for flanging and gently tug baby's chin. Brandi Russo remarked that this changed improved her comfort at the the breast.  I then helped Brandi Russo transition to cradle hold once baby became rhythmic at the breast.  I educated at the bedside on breast feeding basics. 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 She ordered a Spectra pump. She pumped quite a bit for her first child and had an abundant milk supply. This is the first time she has tried to exclusively breast feed.  We discussed when to introduce pumping and bottles; I recommended she try to exclusively breast feed the first few weeks to help establish her milk supply. We also discussed ways to avoid creating an oversupply as she has a history of oversupply.  Brandi Russo had three small red blisters on her right nipple which appear to be healing. She states that she popped them. I tried to discourage her from popping her own blisters and encouraged her to use the  comfort gels instead (or ask her OB provider for hep if they do not resolve with some warm moist heat and breast feeding).  I educated on how to treat engorgement and shared our community resources. Brandi Russo follows up with [facility].  I encouraged Brandi Russo to call our lactation warm line, attend support groups and/or set up an OP appointment, particularly if there are any lingering issues with painful latch or milk supply (or if baby is not gaining appropriately). She verbalized understanding and all questions were answered.   Maternal Data Formula Feeding for Exclusion: No Has patient been taught Hand Expression?: Yes Does the patient have breastfeeding experience prior to this delivery?: Yes  Feeding Feeding Type: Breast Fed  LATCH Score Latch: Grasps breast easily, tongue down, lips flanged, rhythmical sucking.  Audible Swallowing: A few with stimulation  Type of Nipple: Everted at rest and after stimulation  Comfort (Breast/Nipple): Soft / non-tender  Hold (Positioning): Assistance needed to correctly position infant at breast and maintain latch.  LATCH Score: 8  Interventions Interventions: Breast feeding basics reviewed;Assisted with latch;Skin to skin;Breast compression;Adjust position;Support pillows;Comfort gels  Lactation Tools Discussed/Used Tools: Comfort gels;Other (comment) (already had comfort gels)   Consult Status Consult Status: Complete    Brandi Russo 10/20/2019, 11:03 AM

## 2019-11-02 DIAGNOSIS — F909 Attention-deficit hyperactivity disorder, unspecified type: Secondary | ICD-10-CM | POA: Diagnosis not present

## 2019-11-02 MED FILL — ADDERALL XR 30 MG CAP SA: 30 | 30 days supply | Qty: 30 | Fill #0

## 2019-11-02 MED FILL — AMPHETAMINE-DEXTROAMPHETAMI: 20 | 30 days supply | Qty: 30 | Fill #0

## 2019-11-17 ENCOUNTER — Other Ambulatory Visit: Payer: Self-pay

## 2019-11-17 ENCOUNTER — Ambulatory Visit (INDEPENDENT_AMBULATORY_CARE_PROVIDER_SITE_OTHER): Payer: 59 | Admitting: Family Medicine

## 2019-11-17 ENCOUNTER — Encounter: Payer: Self-pay | Admitting: Family Medicine

## 2019-11-17 DIAGNOSIS — Z30014 Encounter for initial prescription of intrauterine contraceptive device: Secondary | ICD-10-CM

## 2019-11-17 MED ORDER — LEVONORGESTREL 19.5 MCG/DAY IU IUD
INTRAUTERINE_SYSTEM | Freq: Once | INTRAUTERINE | Status: AC
  Administered 2019-11-17: 1 via INTRAUTERINE

## 2019-11-17 MED FILL — AMPHETAMINE-DEXTROAMPHETAMI: 20 | 30 days supply | Qty: 30 | Fill #0

## 2019-11-17 MED FILL — ADDERALL XR 30 MG CAP SA: 30 | 30 days supply | Qty: 30 | Fill #0

## 2019-11-17 NOTE — Patient Instructions (Signed)

## 2019-11-17 NOTE — Progress Notes (Signed)
    Post Partum Visit Note  Brandi Russo is a 34 y.o. G64P2002 female who presents for a postpartum visit. She is 4 weeks postpartum following a normal spontaneous vaginal delivery.  I have fully reviewed the prenatal and intrapartum course. The delivery was at 38 gestational weeks.  Anesthesia: none. Postpartum course has been normal. Baby is doing well. Baby is feeding by both breast and bottle - Similac Advance. Bleeding brown. Bowel function is normal. Bladder function is normal. Patient is not sexually active. Contraception method is IUD. Postpartum depression screening: negative.  The following portions of the patient's history were reviewed and updated as appropriate: allergies, current medications, past family history, past medical history, past social history, past surgical history and problem list.  Review of Systems Pertinent items are noted in HPI.    Objective:  Blood pressure 111/77, pulse 71, weight 209 lb 1.3 oz (94.8 kg), last menstrual period 01/19/2019, unknown if currently breastfeeding.  General:  alert, cooperative and no distress  Lungs: clear to auscultation bilaterally  Heart:  regular rate and rhythm, S1, S2 normal, no murmur, click, rub or gallop  Abdomen: soft, non-tender; bowel sounds normal; no masses,  no organomegaly   Vulva:  normal  Vagina: normal vagina, no discharge, exudate, lesion, or erythema  Cervix:  multiparous appearance  Corpus: normal size, contour, position, consistency, mobility, non-tender        IUD Procedure Note Patient identified, informed consent performed, signed copy in chart, time out was performed.  Urine pregnancy test negative.  Speculum placed in the vagina.  Cervix visualized.  Cleaned with Betadine x 2.  Anterior lip of the cervix was injected with lidocaine 2% with epi 59mL. Anterior lip of cervix was then grasped with a single tooth tenaculum.  Uterus sounded to 8 cm.  Liletta  IUD placed per manufacturer's recommendations.   Strings trimmed to 3 cm. Tenaculum was removed, good hemostasis noted.  Patient tolerated procedure well.   Patient given post procedure instructions and Liletta care card with expiration date.  Patient is asked to check IUD strings periodically and follow up in 4-6 weeks for IUD check.   Assessment:    Normal postpartum exam. Pap smear not done at today's visit.   Plan:   Essential components of care per ACOG recommendations:  1.  Mood and well being: Patient with negative depression screening today. Reviewed local resources for support.  - Patient does not use tobacco. - hx of drug use? No    2. Infant care and feeding:  -Patient currently breastmilk feeding? Yes  -Social determinants of health (SDOH) reviewed in EPIC. No concerns  3. Sexuality, contraception and birth spacing - Patient does not want a pregnancy in the next year.   - Reviewed forms of contraception in tiered fashion. Patient desired IUD today.   - Discussed birth spacing of 18 months  4. Sleep and fatigue -Encouraged family/partner/community support of 4 hrs of uninterrupted sleep to help with mood and fatigue  5. Physical Recovery  - Discussed patients delivery and complications - Patient had a no laceration, perineal healing reviewed.  - Patient has urinary incontinence? No  - Patient is not safe to resume physical and sexual activity  6.  Health Maintenance - Last pap smear done 04/2019 and was normal with negative HPV.  7. GDM Will need 2hr GTT or HgA1c postpartum  Levie Heritage, DO [facility] [employer], [facility]

## 2019-12-15 ENCOUNTER — Ambulatory Visit (INDEPENDENT_AMBULATORY_CARE_PROVIDER_SITE_OTHER): Payer: 59 | Admitting: Family Medicine

## 2019-12-15 ENCOUNTER — Other Ambulatory Visit: Payer: Self-pay

## 2019-12-15 ENCOUNTER — Encounter: Payer: Self-pay | Admitting: Family Medicine

## 2019-12-15 VITALS — BP 107/78 | HR 71 | Wt 212.0 lb

## 2019-12-15 DIAGNOSIS — Z30431 Encounter for routine checking of intrauterine contraceptive device: Secondary | ICD-10-CM

## 2019-12-15 NOTE — Progress Notes (Signed)
Patient is here for a string check. Armandina Stammer RN

## 2019-12-15 NOTE — Progress Notes (Signed)
   Subjective:   Patient Name: Brandi Russo, female   DOB: [birth year], 34 y.o.  MRN: [id number]  HPI Patient here for an IUD check.  She had the Liletta IUD placed 1 month ago.  She reports spotting.   Review of Systems  Constitutional: Negative for fever and chills.  Gastrointestinal: Negative for abdominal pain.  Genitourinary: Negative for vaginal discharge, vaginal pain, pelvic pain and dyspareunia.        Objective:   Physical Exam  Constitutional: She appears well-developed and well-nourished.  HENT:  Head: Normocephalic and atraumatic.  Abdominal: Soft. There is no tenderness. There is no guarding.  Genitourinary: There is no rash, tenderness or lesion on the right labia. There is no rash, tenderness or lesion on the left labia. No erythema or tenderness in the vagina. No foreign body around the vagina. No signs of injury around the vagina. No vaginal discharge found.    Skin: Skin is warm and dry.  Psychiatric: She has a normal mood and affect. Her behavior is normal. Judgment and thought content normal.       Assessment & Plan:  1. IUD check up IUD in place.  Pt to call with any other problems.  Recheck in 1 year.

## 2019-12-20 DIAGNOSIS — G5600 Carpal tunnel syndrome, unspecified upper limb: Secondary | ICD-10-CM | POA: Diagnosis not present

## 2019-12-21 MED FILL — ADDERALL XR 30 MG CAP SA: 30 | 30 days supply | Qty: 30 | Fill #0

## 2019-12-21 MED FILL — AMPHETAMINE-DEXTROAMPHETAMI: 20 | 30 days supply | Qty: 30 | Fill #0

## 2020-01-25 DIAGNOSIS — F909 Attention-deficit hyperactivity disorder, unspecified type: Secondary | ICD-10-CM | POA: Diagnosis not present

## 2020-01-25 MED FILL — ADDERALL XR 30 MG CAP SA: 30 | 30 days supply | Qty: 30 | Fill #0

## 2020-01-25 MED FILL — AMPHETAMINE-DEXTROAMPHETAMI: 20 | 30 days supply | Qty: 30 | Fill #0

## 2020-02-24 MED FILL — AMPHETAMINE-DEXTROAMPHETAMI: 20 | 30 days supply | Qty: 30 | Fill #0

## 2020-02-24 MED FILL — ADDERALL XR 30 MG CAP SA: 30 | 30 days supply | Qty: 30 | Fill #0

## 2020-04-05 MED FILL — ADDERALL XR 30 MG CAP SA: 30 | 30 days supply | Qty: 30 | Fill #0

## 2020-04-05 MED FILL — AMPHETAMINE-DEXTROAMPHETAMI: 20 | 30 days supply | Qty: 30 | Fill #0

## 2020-04-20 ENCOUNTER — Other Ambulatory Visit (HOSPITAL_BASED_OUTPATIENT_CLINIC_OR_DEPARTMENT_OTHER): Payer: Self-pay | Admitting: Psychiatry

## 2020-04-20 DIAGNOSIS — F909 Attention-deficit hyperactivity disorder, unspecified type: Secondary | ICD-10-CM | POA: Diagnosis not present

## 2020-05-11 MED FILL — ADDERALL XR 30 MG CAP SA: 30 | 30 days supply | Qty: 30 | Fill #0

## 2020-05-11 MED FILL — AMPHETAMINE-DEXTROAMPHETAMI: 20 | 30 days supply | Qty: 30 | Fill #0

## 2020-05-15 ENCOUNTER — Other Ambulatory Visit (HOSPITAL_BASED_OUTPATIENT_CLINIC_OR_DEPARTMENT_OTHER): Payer: Self-pay | Admitting: Physician Assistant

## 2020-05-15 DIAGNOSIS — M25562 Pain in left knee: Secondary | ICD-10-CM | POA: Diagnosis not present

## 2020-05-15 DIAGNOSIS — M238X2 Other internal derangements of left knee: Secondary | ICD-10-CM | POA: Diagnosis not present

## 2020-05-15 MED FILL — MELOXICAM 7.5 MG TABLET: 7.5 | 14 days supply | Qty: 28 | Fill #0

## 2020-05-25 DIAGNOSIS — G5603 Carpal tunnel syndrome, bilateral upper limbs: Secondary | ICD-10-CM | POA: Diagnosis not present

## 2020-06-05 ENCOUNTER — Other Ambulatory Visit (HOSPITAL_BASED_OUTPATIENT_CLINIC_OR_DEPARTMENT_OTHER): Payer: Self-pay | Admitting: Orthopaedic Surgery

## 2020-06-05 MED FILL — CEPHALEXIN 500 MG CAPSULE: 500 | 8 days supply | Qty: 32 | Fill #0

## 2020-06-12 ENCOUNTER — Other Ambulatory Visit (HOSPITAL_BASED_OUTPATIENT_CLINIC_OR_DEPARTMENT_OTHER): Payer: Self-pay | Admitting: Orthopaedic Surgery

## 2020-06-12 DIAGNOSIS — G5601 Carpal tunnel syndrome, right upper limb: Secondary | ICD-10-CM | POA: Diagnosis not present

## 2020-06-12 MED FILL — HYDROCODON-APAP 5-325: 5-325 | 2 days supply | Qty: 10 | Fill #0

## 2020-06-20 MED FILL — AMPHETAMINE-DEXTROAMPHETAMI: 20 | 30 days supply | Qty: 30 | Fill #0

## 2020-06-20 MED FILL — ADDERALL XR 30 MG CAP SA: 30 | 30 days supply | Qty: 30 | Fill #0

## 2020-06-26 DIAGNOSIS — M25631 Stiffness of right wrist, not elsewhere classified: Secondary | ICD-10-CM | POA: Diagnosis not present

## 2020-07-12 ENCOUNTER — Other Ambulatory Visit (HOSPITAL_BASED_OUTPATIENT_CLINIC_OR_DEPARTMENT_OTHER): Payer: Self-pay | Admitting: Psychiatry

## 2020-07-12 DIAGNOSIS — F909 Attention-deficit hyperactivity disorder, unspecified type: Secondary | ICD-10-CM | POA: Diagnosis not present

## 2020-08-16 ENCOUNTER — Other Ambulatory Visit (HOSPITAL_BASED_OUTPATIENT_CLINIC_OR_DEPARTMENT_OTHER): Payer: Self-pay

## 2020-08-16 MED FILL — Amphetamine-Dextroamphetamine Cap ER 24HR 30 MG: ORAL | 30 days supply | Qty: 30 | Fill #0 | Status: AC

## 2020-08-16 MED FILL — Amphetamine-Dextroamphetamine Tab 20 MG: ORAL | 30 days supply | Qty: 30 | Fill #0 | Status: AC

## 2020-09-19 ENCOUNTER — Other Ambulatory Visit (HOSPITAL_BASED_OUTPATIENT_CLINIC_OR_DEPARTMENT_OTHER): Payer: Self-pay

## 2020-09-19 MED FILL — Amphetamine-Dextroamphetamine Tab 20 MG: ORAL | 30 days supply | Qty: 30 | Fill #0 | Status: AC

## 2020-09-19 MED FILL — Amphetamine-Dextroamphetamine Cap ER 24HR 30 MG: ORAL | 30 days supply | Qty: 30 | Fill #0 | Status: AC

## 2020-09-21 ENCOUNTER — Ambulatory Visit: Payer: 59 | Admitting: Family

## 2020-10-03 ENCOUNTER — Other Ambulatory Visit (HOSPITAL_BASED_OUTPATIENT_CLINIC_OR_DEPARTMENT_OTHER): Payer: Self-pay

## 2020-10-03 DIAGNOSIS — F909 Attention-deficit hyperactivity disorder, unspecified type: Secondary | ICD-10-CM | POA: Diagnosis not present

## 2020-10-03 MED ORDER — AMPHETAMINE-DEXTROAMPHETAMINE 20 MG PO TABS
ORAL_TABLET | ORAL | 0 refills | Status: DC
  Filled 2020-11-30: qty 30, 30d supply, fill #0

## 2020-10-03 MED ORDER — AMPHETAMINE-DEXTROAMPHETAMINE 20 MG PO TABS: ORAL_TABLET | ORAL | 0 refills | Status: DC

## 2020-10-03 MED ORDER — AMPHETAMINE-DEXTROAMPHET ER 30 MG PO CP24
ORAL_CAPSULE | ORAL | 0 refills | Status: DC
  Filled 2020-10-26: qty 30, 30d supply, fill #0

## 2020-10-03 MED ORDER — AMPHETAMINE-DEXTROAMPHET ER 30 MG PO CP24: ORAL_CAPSULE | ORAL | 0 refills | Status: DC

## 2020-10-03 MED ORDER — AMPHETAMINE-DEXTROAMPHET ER 30 MG PO CP24
ORAL_CAPSULE | ORAL | 0 refills | Status: DC
  Filled 2020-11-30: qty 30, 30d supply, fill #0

## 2020-10-03 MED ORDER — AMPHETAMINE-DEXTROAMPHETAMINE 20 MG PO TABS
ORAL_TABLET | ORAL | 0 refills | Status: DC
  Filled 2020-10-26: qty 30, 30d supply, fill #0

## 2020-10-26 ENCOUNTER — Other Ambulatory Visit (HOSPITAL_BASED_OUTPATIENT_CLINIC_OR_DEPARTMENT_OTHER): Payer: Self-pay

## 2020-11-29 ENCOUNTER — Other Ambulatory Visit (HOSPITAL_BASED_OUTPATIENT_CLINIC_OR_DEPARTMENT_OTHER): Payer: Self-pay

## 2020-11-30 ENCOUNTER — Other Ambulatory Visit (HOSPITAL_BASED_OUTPATIENT_CLINIC_OR_DEPARTMENT_OTHER): Payer: Self-pay

## 2021-01-16 ENCOUNTER — Other Ambulatory Visit (HOSPITAL_BASED_OUTPATIENT_CLINIC_OR_DEPARTMENT_OTHER): Payer: Self-pay

## 2021-01-16 MED ORDER — AMPHETAMINE-DEXTROAMPHETAMINE 20 MG PO TABS
ORAL_TABLET | ORAL | 0 refills | Status: DC
  Filled 2021-02-21: qty 30, 30d supply, fill #0

## 2021-01-16 MED ORDER — AMPHETAMINE-DEXTROAMPHETAMINE 20 MG PO TABS
ORAL_TABLET | ORAL | 0 refills | Status: DC
  Filled 2021-02-21 – 2021-04-01 (×2): qty 30, 30d supply, fill #0

## 2021-01-16 MED ORDER — AMPHETAMINE-DEXTROAMPHET ER 30 MG PO CP24
ORAL_CAPSULE | ORAL | 0 refills | Status: DC
  Filled 2021-04-01: qty 30, 30d supply, fill #0

## 2021-01-16 MED ORDER — AMPHETAMINE-DEXTROAMPHETAMINE 20 MG PO TABS
20.0000 mg | ORAL_TABLET | Freq: Every day | ORAL | 0 refills | Status: DC
  Filled 2021-01-16: qty 30, 30d supply, fill #0

## 2021-01-16 MED ORDER — AMPHETAMINE-DEXTROAMPHET ER 30 MG PO CP24
ORAL_CAPSULE | ORAL | 0 refills | Status: DC
  Filled 2021-02-21: qty 30, 30d supply, fill #0

## 2021-01-16 MED ORDER — AMPHETAMINE-DEXTROAMPHET ER 30 MG PO CP24
30.0000 mg | ORAL_CAPSULE | Freq: Every morning | ORAL | 0 refills | Status: DC
  Filled 2021-01-16: qty 30, 30d supply, fill #0

## 2021-02-21 ENCOUNTER — Other Ambulatory Visit (HOSPITAL_BASED_OUTPATIENT_CLINIC_OR_DEPARTMENT_OTHER): Payer: Self-pay

## 2021-02-22 ENCOUNTER — Other Ambulatory Visit (HOSPITAL_COMMUNITY): Payer: Self-pay

## 2021-03-04 ENCOUNTER — Other Ambulatory Visit (HOSPITAL_BASED_OUTPATIENT_CLINIC_OR_DEPARTMENT_OTHER): Payer: Self-pay

## 2021-03-26 ENCOUNTER — Other Ambulatory Visit (HOSPITAL_BASED_OUTPATIENT_CLINIC_OR_DEPARTMENT_OTHER): Payer: Self-pay

## 2021-04-01 ENCOUNTER — Other Ambulatory Visit (HOSPITAL_BASED_OUTPATIENT_CLINIC_OR_DEPARTMENT_OTHER): Payer: Self-pay

## 2021-05-03 ENCOUNTER — Other Ambulatory Visit (HOSPITAL_BASED_OUTPATIENT_CLINIC_OR_DEPARTMENT_OTHER): Payer: Self-pay

## 2021-05-06 ENCOUNTER — Other Ambulatory Visit (HOSPITAL_BASED_OUTPATIENT_CLINIC_OR_DEPARTMENT_OTHER): Payer: Self-pay

## 2021-05-07 ENCOUNTER — Other Ambulatory Visit (HOSPITAL_BASED_OUTPATIENT_CLINIC_OR_DEPARTMENT_OTHER): Payer: Self-pay

## 2021-05-13 ENCOUNTER — Other Ambulatory Visit (HOSPITAL_BASED_OUTPATIENT_CLINIC_OR_DEPARTMENT_OTHER): Payer: Self-pay

## 2021-05-16 ENCOUNTER — Other Ambulatory Visit (HOSPITAL_BASED_OUTPATIENT_CLINIC_OR_DEPARTMENT_OTHER): Payer: Self-pay

## 2021-05-16 DIAGNOSIS — F909 Attention-deficit hyperactivity disorder, unspecified type: Secondary | ICD-10-CM | POA: Diagnosis not present

## 2021-05-16 MED ORDER — AMPHETAMINE-DEXTROAMPHETAMINE 20 MG PO TABS
20.0000 mg | ORAL_TABLET | Freq: Every day | ORAL | 0 refills | Status: DC
  Filled 2021-06-19 – 2021-08-27 (×2): qty 30, 30d supply, fill #0

## 2021-05-16 MED ORDER — AMPHETAMINE-DEXTROAMPHET ER 30 MG PO CP24
30.0000 mg | ORAL_CAPSULE | Freq: Every morning | ORAL | 0 refills | Status: DC
  Filled 2021-05-16 – 2021-05-31 (×2): qty 30, 30d supply, fill #0

## 2021-05-16 MED ORDER — AMPHETAMINE-DEXTROAMPHETAMINE 20 MG PO TABS
20.0000 mg | ORAL_TABLET | Freq: Every day | ORAL | 0 refills | Status: DC
  Filled 2021-06-19: qty 30, 30d supply, fill #0

## 2021-05-16 MED ORDER — AMPHETAMINE-DEXTROAMPHETAMINE 20 MG PO TABS
20.0000 mg | ORAL_TABLET | Freq: Every day | ORAL | 0 refills | Status: DC
  Filled 2021-05-16: qty 30, 30d supply, fill #0

## 2021-05-16 MED ORDER — AMPHETAMINE-DEXTROAMPHET ER 30 MG PO CP24
30.0000 mg | ORAL_CAPSULE | Freq: Every morning | ORAL | 0 refills | Status: DC
  Filled 2021-08-27: qty 30, 30d supply, fill #0

## 2021-05-16 MED ORDER — AMPHETAMINE-DEXTROAMPHET ER 30 MG PO CP24
30.0000 mg | ORAL_CAPSULE | Freq: Every morning | ORAL | 0 refills | Status: DC
  Filled 2021-07-01: qty 20, 20d supply, fill #0

## 2021-05-20 ENCOUNTER — Other Ambulatory Visit (HOSPITAL_BASED_OUTPATIENT_CLINIC_OR_DEPARTMENT_OTHER): Payer: Self-pay

## 2021-05-21 ENCOUNTER — Other Ambulatory Visit (HOSPITAL_BASED_OUTPATIENT_CLINIC_OR_DEPARTMENT_OTHER): Payer: Self-pay

## 2021-05-23 ENCOUNTER — Other Ambulatory Visit (HOSPITAL_BASED_OUTPATIENT_CLINIC_OR_DEPARTMENT_OTHER): Payer: Self-pay

## 2021-05-24 ENCOUNTER — Other Ambulatory Visit (HOSPITAL_BASED_OUTPATIENT_CLINIC_OR_DEPARTMENT_OTHER): Payer: Self-pay

## 2021-05-27 ENCOUNTER — Other Ambulatory Visit (HOSPITAL_BASED_OUTPATIENT_CLINIC_OR_DEPARTMENT_OTHER): Payer: Self-pay

## 2021-05-28 ENCOUNTER — Other Ambulatory Visit (HOSPITAL_BASED_OUTPATIENT_CLINIC_OR_DEPARTMENT_OTHER): Payer: Self-pay

## 2021-05-29 ENCOUNTER — Other Ambulatory Visit (HOSPITAL_BASED_OUTPATIENT_CLINIC_OR_DEPARTMENT_OTHER): Payer: Self-pay

## 2021-05-30 ENCOUNTER — Other Ambulatory Visit (HOSPITAL_BASED_OUTPATIENT_CLINIC_OR_DEPARTMENT_OTHER): Payer: Self-pay

## 2021-05-31 ENCOUNTER — Other Ambulatory Visit (HOSPITAL_BASED_OUTPATIENT_CLINIC_OR_DEPARTMENT_OTHER): Payer: Self-pay

## 2021-06-03 ENCOUNTER — Other Ambulatory Visit (HOSPITAL_BASED_OUTPATIENT_CLINIC_OR_DEPARTMENT_OTHER): Payer: Self-pay

## 2021-06-18 ENCOUNTER — Other Ambulatory Visit (HOSPITAL_BASED_OUTPATIENT_CLINIC_OR_DEPARTMENT_OTHER): Payer: Self-pay

## 2021-06-19 ENCOUNTER — Other Ambulatory Visit (HOSPITAL_BASED_OUTPATIENT_CLINIC_OR_DEPARTMENT_OTHER): Payer: Self-pay

## 2021-06-25 ENCOUNTER — Other Ambulatory Visit (HOSPITAL_BASED_OUTPATIENT_CLINIC_OR_DEPARTMENT_OTHER): Payer: Self-pay

## 2021-07-01 ENCOUNTER — Encounter: Payer: Self-pay | Admitting: Family Medicine

## 2021-07-01 ENCOUNTER — Ambulatory Visit: Payer: BC Managed Care – PPO | Admitting: Family Medicine

## 2021-07-01 ENCOUNTER — Other Ambulatory Visit (HOSPITAL_BASED_OUTPATIENT_CLINIC_OR_DEPARTMENT_OTHER): Payer: Self-pay

## 2021-07-01 VITALS — BP 155/95 | HR 74 | Ht 64.0 in | Wt 210.6 lb

## 2021-07-01 DIAGNOSIS — Z1159 Encounter for screening for other viral diseases: Secondary | ICD-10-CM

## 2021-07-01 DIAGNOSIS — E669 Obesity, unspecified: Secondary | ICD-10-CM

## 2021-07-01 DIAGNOSIS — J0140 Acute pansinusitis, unspecified: Secondary | ICD-10-CM

## 2021-07-01 DIAGNOSIS — E119 Type 2 diabetes mellitus without complications: Secondary | ICD-10-CM

## 2021-07-01 DIAGNOSIS — R5383 Other fatigue: Secondary | ICD-10-CM | POA: Diagnosis not present

## 2021-07-01 LAB — CBC
HCT: 41.2 % (ref 36.0–46.0)
Hemoglobin: 14 g/dL (ref 12.0–15.0)
MCHC: 33.9 g/dL (ref 30.0–36.0)
MCV: 85 fl (ref 78.0–100.0)
Platelets: 424 10*3/uL — ABNORMAL HIGH (ref 150.0–400.0)
RBC: 4.84 Mil/uL (ref 3.87–5.11)
RDW: 12.1 % (ref 11.5–15.5)
WBC: 7 10*3/uL (ref 4.0–10.5)

## 2021-07-01 LAB — COMPREHENSIVE METABOLIC PANEL
ALT: 32 U/L (ref 0–35)
AST: 23 U/L (ref 0–37)
Albumin: 4.4 g/dL (ref 3.5–5.2)
Alkaline Phosphatase: 56 U/L (ref 39–117)
BUN: 11 mg/dL (ref 6–23)
CO2: 32 mEq/L (ref 19–32)
Calcium: 9.8 mg/dL (ref 8.4–10.5)
Chloride: 101 mEq/L (ref 96–112)
Creatinine, Ser: 0.79 mg/dL (ref 0.40–1.20)
GFR: 98.29 mL/min (ref >60.00)
Glucose, Bld: 189 mg/dL — ABNORMAL HIGH (ref 70–99)
Potassium: 4.2 mEq/L (ref 3.5–5.1)
Sodium: 140 mEq/L (ref 135–145)
Total Bilirubin: 0.6 mg/dL (ref 0.2–1.2)
Total Protein: 7.7 g/dL (ref 6.0–8.3)

## 2021-07-01 LAB — LIPID PANEL
Cholesterol: 152 mg/dL (ref 0–200)
HDL: 49.7 mg/dL (ref >39.00)
LDL Cholesterol: 75 mg/dL (ref 0–99)
NonHDL: 101.92
Total CHOL/HDL Ratio: 3
Triglycerides: 137 mg/dL (ref 0.0–149.0)
VLDL: 27.4 mg/dL (ref 0.0–40.0)

## 2021-07-01 LAB — HEMOGLOBIN A1C: Hgb A1c MFr Bld: 8.9 % — ABNORMAL HIGH (ref 4.6–6.5)

## 2021-07-01 MED ORDER — AMOXICILLIN-POT CLAVULANATE 875-125 MG PO TABS
1.0000 | ORAL_TABLET | Freq: Two times a day (BID) | ORAL | 0 refills | Status: AC
  Filled 2021-07-01 (×2): qty 20, 10d supply, fill #0

## 2021-07-01 NOTE — Progress Notes (Signed)
______________________________________________________________________  HPI Brandi Russo Brandi Russo is a 36 y.o. female presenting to [facility] at [facility] today to establish care.   Patient Care Team: Clayborne Dana, NP as PCP - General (Family Medicine)  Health Maintenance  Topic Date Due   Urine Protein Check  Never done   Hepatitis C Screening: USPSTF Recommendation to screen - Ages 7-79 yo.  Never done   COVID-19 Vaccine (3 - Booster for Pfizer series) 03/09/2020   Pap Smear  04/05/2022   Tetanus Vaccine  08/08/2029   Flu Shot  Completed   HIV Screening  Completed   HPV Vaccine  Aged Out     Concerns today: URI - multiple weeks, sinus pressures, headaches, nasal congestion, rhinorrhea, no improvement with OTC remedies; denies chest pain, dyspnea, fevers, GI/GU symptoms.  Fatigue - mild, seems to be improving as household is getting well from recent viruses,  Obesity - tried diets (keto, whole30, etc.), portion control, exercise (walking multiple days per week), myfitness pal app, intermittent fasting; reports hx of gestational diabetes; she is interested in weight loss meds   Patient Active Problem List   Diagnosis Date Noted   Bilateral carpal tunnel syndrome 06/02/2019   COVID-19 affecting pregnancy in second trimester 05/10/2019   Prediabetes 04/14/2019    PHQ9 Today: Depression screen PHQ 2/9 05/04/2019  Decreased Interest 0  Down, Depressed, Hopeless 0  PHQ - 2 Score 0   GAD7 Today: No flowsheet data found. ______________________________________________________________________ PMH Past Medical History:  Diagnosis Date   ADHD    Gestational diabetes    History of gestational diabetes     ROS All review of systems negative except what is listed in the HPI  PHYSICAL EXAM Physical Exam Vitals reviewed.  Constitutional:      Appearance: Normal appearance. She is obese.  Cardiovascular:     Rate and Rhythm: Normal rate and regular  rhythm.     Pulses: Normal pulses.     Heart sounds: Normal heart sounds.  Pulmonary:     Effort: Pulmonary effort is normal.     Breath sounds: Normal breath sounds.  Musculoskeletal:     Cervical back: Normal range of motion and neck supple.  Lymphadenopathy:     Cervical: No cervical adenopathy.  Skin:    General: Skin is warm and dry.  Neurological:     General: No focal deficit present.     Mental Status: She is alert and oriented to person, place, and time. Mental status is at baseline.  Psychiatric:        Mood and Affect: Mood normal.        Behavior: Behavior normal.        Thought Content: Thought content normal.        Judgment: Judgment normal.   ______________________________________________________________________ ASSESSMENT AND PLAN  1. Acute non-recurrent pansinusitis Given duration, treating with Augmentin. Continue supportive measures including rest, hydration, humidifier use, steam showers, warm compresses to sinuses, warm liquids with lemon and honey, and over-the-counter cough, cold, and analgesics as needed.  Patient aware of signs/symptoms requiring further/urgent evaluation.  - amoxicillin-clavulanate (AUGMENTIN) 875-125 MG tablet; Take 1 tablet by mouth 2 (two) times daily for 10 days.  Dispense: 20 tablet; Refill: 0  2. Fatigue, unspecified type 3. Obesity (BMI 35.0-39.9 without comorbidity) Starting with labs today to ensure to comorbidities/factors. Consider [id number] if labs stable. Fatigue seems to be improving as household illness is improving. 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 -  Hemoglobin A1c - CBC - Comprehensive metabolic panel - Lipid panel - TSH  4. Encounter for hepatitis C screening test for low risk patient - Hepatitis C antibody   Establish care Education provided today during visit and on AVS for patient to review at home.  Diet and Exercise recommendations provided.  Current diagnoses and recommendations  discussed. HM recommendations reviewed with recommendations.   Elevated BP Recommend monitoring at home and keeping a log - bring log and home machine to follow-up appointment.    Return in about 2 weeks (around 07/15/2021) for blood pressure f/u - Ladona Ridgel .    Lollie Marrow Reola Calkins, DNP, FNP-C

## 2021-07-01 NOTE — Progress Notes (Signed)
Wants to discuss weight loss options Cpe-fasting this morning

## 2021-07-01 NOTE — Patient Instructions (Signed)
Thank you for choosing [facility] at [facility] for your Primary Care needs. I am excited for the opportunity to partner with you to meet your health care goals. It was a pleasure meeting you today!  Recommendations from today's visit: Blood pressure is not at goal for age and co-morbidities.  I recommend monitoring for 2 weeks.  In addition they were instructed on the following: - BP goal <130/80 - monitor and log blood pressures at home - check around the same time each day in a relaxed setting - Limit salt to <2000 mg/day - Follow DASH eating plan (heart healthy diet) - limit alcohol to 2 standard drinks per day for men and 1 per day for women - avoid tobacco products - get at least 2 hours of regular aerobic exercise weekly Patient aware of signs/symptoms requiring further/urgent evaluation. Labs updated today.   Information on diet, exercise, and health maintenance recommendations are listed below. This is information to help you be sure you are on track for optimal health and monitoring.   Please look over this and let us know if you have any questions or if you have completed any of the health maintenance outside of [place] so that we can be sure your records are up to date.  ___________________________________________________________  MyChart:  For all urgent or time sensitive needs we ask that you please call the office to avoid delays. Our number is [phone]. MyChart is not constantly monitored and due to the large volume of messages a day, replies may take up to 72 business hours.  MyChart Policy: MyChart allows for you to see your visit notes, after visit summary, provider recommendations, lab and tests results, make an appointment, request refills, and contact your provider or the office for non-urgent questions or concerns. Providers are seeing patients during normal business hours and do not have built in time to review MyChart messages.  We ask  that you allow a minimum of 3 business days for responses to [employer]. For this reason, please do not send urgent requests through [place]. Please call the office at [phone]. New and ongoing conditions may require a visit. We have virtual and in-person visits available for your convenience.  Complex MyChart concerns may require a visit. Your provider may request you schedule a virtual or in-person visit to ensure we are providing the best care possible. MyChart messages sent after 11:00 AM on Friday will not be received by the provider until Monday morning.    Lab and Test Results: You will receive your lab and test results on MyChart as soon as they are completed and results have been sent by the lab or testing facility. Due to this service, you will receive your results BEFORE your provider.  I review lab and test results each morning prior to seeing patients. Some results require collaboration with other providers to ensure you are receiving the most appropriate care. For this reason, we ask that you please allow a minimum of 3-5 business days from the time that ALL results have been received for your provider to receive and review lab and test results and contact you about these.  Most lab and test result comments from the provider will be sent through [place]. Your provider may recommend changes to the plan of care, follow-up visits, repeat testing, ask questions, or request an office visit to discuss these results. You may reply directly to this message or call the office to provide information for the provider  or set up an appointment. In some instances, you will be called with test results and recommendations. Please let us know if this is preferred and we will make note of this in your chart to provide this for you.    If you have not heard a response to your lab or test results in 5 business days from all results returning to [place], please call the office to let us know. We ask  that you please avoid calling prior to this time unless there is an emergent concern. Due to high call volumes, this can delay the resulting process.  After Hours: For all non-emergency after hours needs, please call the office at [phone] and select the option to reach the on-call  service. On-call services are shared between multiple [facility] offices and therefore it will not be possible to speak directly with your provider. On-call providers may provide medical advice and recommendations, but are unable to provide refills for maintenance medications.  For all emergency or urgent medical needs after normal business hours, we recommend that you seek care at the closest Urgent Care or Emergency Department to ensure appropriate treatment in a timely manner.  [facility] has a 24 hour emergency room located on the ground floor for your convenience.   Urgent Concerns During the Business Day Providers are seeing patients from 8AM to [place] with a busy schedule and are most often not able to respond to non-urgent calls until the end of the day or the next business day. If you should have URGENT concerns during the day, please call and speak to the nurse or schedule a same day appointment so that we can address your concern without delay.   Thank you, again, for choosing me as your health care partner. I appreciate your trust and look forward to learning more about you.   Purcell Nails Olevia Bowens, DNP, FNP-C  ___________________________________________________________  Health Maintenance Recommendations Screening Testing Mammogram Every 1-2 years based on history and risk factors Starting at age 59 Pap Smear Ages 21-39 every 3 years Ages 49-65 every 5 years with HPV testing More frequent testing may be required based on results and history Colon Cancer Screening Every 1-10 years based on test performed, risk factors, and history Starting at age 19 Bone Density Screening Every  2-10 years based on history Starting at age 10 for women Recommendations for men differ based on medication usage, history, and risk factors AAA Screening One time ultrasound Men 20-72 years old who have ever smoked Lung Cancer Screening Low Dose Lung CT every 12 months Age 93-80 years with a 20 pack-year smoking history who still smoke or who have quit within the last 15 years  Screening Labs Routine  Labs: Complete Blood Count (CBC), Complete Metabolic Panel (CMP), Cholesterol (Lipid Panel) Every 6-12 months based on history and medications May be recommended more frequently based on current conditions or previous results Hemoglobin A1c Lab Every 3-12 months based on history and previous results Starting at age 50 or earlier with diagnosis of diabetes, high cholesterol, BMI >26, and/or risk factors Frequent monitoring for patients with diabetes to ensure blood sugar control Thyroid Panel (TSH w/ T3 & T4) Every 6 months based on history, symptoms, and risk factors May be repeated more often if on medication HIV One time testing for all patients 39 and older May be repeated more frequently for patients with increased risk factors or exposure Hepatitis C One time testing for all patients 19 and older May  be repeated more frequently for patients with increased risk factors or exposure Gonorrhea, Chlamydia Every 12 months for all sexually active persons 13-24 years Additional monitoring may be recommended for those who are considered high risk or who have symptoms PSA Men 7-71 years old with risk factors Additional screening may be recommended from age 901-69 based on risk factors, symptoms, and history  Vaccine Recommendations Tetanus Booster All adults every 10 years Flu Vaccine All patients 6 months and older every year COVID Vaccine All patients 12 years and older Initial dosing with booster May recommend additional booster based on age and health history HPV Vaccine 2  doses all patients age 90-26 Dosing may be considered for patients over 26 Shingles Vaccine (Shingrix) 2 doses all adults 25 years and older Pneumonia (Pneumovax 38) All adults 51 years and older May recommend earlier dosing based on health history Pneumonia (Prevnar 6) All adults 58 years and older Dosed 1 year after Pneumovax 23 Pneumonia (Prevnar 44) All adults 32 years and older (adults [id number] with certain conditions or risk factors) 1 dose  For those who have no received Prevnar 13 vaccine previously   Additional Screening, Testing, and Vaccinations may be recommended on an individualized basis based on family history, health history, risk factors, and/or exposure.  __________________________________________________________  Diet Recommendations for All Patients  I recommend that all patients maintain a diet low in saturated fats, carbohydrates, and cholesterol. While this can be challenging at first, it is not impossible and small changes can make big differences.  Things to try: Decreasing the amount of soda, sweet tea, and/or juice to one or less per day and replace with water While water is always the first choice, if you do not like water you may consider adding a water additive without sugar to improve the taste other sugar free drinks Replace potatoes with a brightly colored vegetable at dinner Use healthy oils, such as canola oil or olive oil, instead of butter or hard margarine Limit your bread intake to two pieces or less a day Replace regular pasta with low carb pasta options Bake, broil, or grill foods instead of frying Monitor portion sizes  Eat smaller, more frequent meals throughout the day instead of large meals  An important thing to remember is, if you love foods that are not great for your health, you don't have to give them up completely. Instead, allow these foods to be a reward when you have done well. Allowing yourself to still have special treats every  once in a while is a nice way to tell yourself thank you for working hard to keep yourself healthy.   Also remember that every day is a new day. If you have a bad day and "fall off the wagon", you can still climb right back up and keep moving along on your journey!  We have resources available to help you!  Some websites that may be helpful include: www.http://carter.biz/  Www.VeryWell[employer].com _____________________________________________________________  Activity Recommendations for All Patients  I recommend that all adults get at least 20 minutes of moderate physical activity that elevates your heart rate at least 5 days out of the week.  Some examples include: Walking or jogging at a pace that allows you to carry on a conversation Cycling (stationary bike or outdoors) Water aerobics Yoga Weight lifting Dancing If physical limitations prevent you from putting stress on your joints, exercise in a pool or seated in a chair are excellent options.  Do determine your MAXIMUM heart rate for  activity: YOUR AGE - 220 = MAX HeartRate   Remember! Do not push yourself too hard.  Start slowly and build up your pace, speed, weight, time in exercise, etc.  Allow your body to rest between exercise and get good sleep. You will need more water than normal when you are exerting yourself. Do not wait until you are thirsty to drink. Drink with a purpose of getting in at least 8, 8 ounce glasses of water a day plus more depending on how much you exercise and sweat.    If you begin to develop dizziness, chest pain, abdominal pain, jaw pain, shortness of breath, headache, vision changes, lightheadedness, or other concerning symptoms, stop the activity and allow your body to rest. If your symptoms are severe, seek emergency evaluation immediately. If your symptoms are concerning, but not severe, please let us know so that we can recommend further evaluation.

## 2021-07-02 ENCOUNTER — Other Ambulatory Visit (HOSPITAL_BASED_OUTPATIENT_CLINIC_OR_DEPARTMENT_OTHER): Payer: Self-pay

## 2021-07-02 LAB — HEPATITIS C ANTIBODY
Hepatitis C Ab: NONREACTIVE
SIGNAL TO CUT-OFF: <0.02 (ref <1.00)

## 2021-07-02 LAB — TSH: TSH: 0.89 u[IU]/mL (ref 0.35–5.50)

## 2021-07-02 MED ORDER — METFORMIN HCL 500 MG PO TABS
500.0000 mg | ORAL_TABLET | Freq: Two times a day (BID) | ORAL | 3 refills | Status: DC
  Filled 2021-07-02: qty 180, 90d supply, fill #0
  Filled 2021-10-01: qty 180, 90d supply, fill #1
  Filled 2021-12-27: qty 180, 90d supply, fill #2
  Filled 2022-04-14: qty 180, 90d supply, fill #3

## 2021-07-02 MED ORDER — OZEMPIC (0.25 OR 0.5 MG/DOSE) 2 MG/1.5ML ~~LOC~~ SOPN
0.2500 mg | PEN_INJECTOR | SUBCUTANEOUS | 1 refills | Status: DC
  Filled 2021-07-02: qty 1.5, 42d supply, fill #0
  Filled 2021-08-02: qty 1.5, 28d supply, fill #1
  Filled 2021-08-07: qty 1.5, 42d supply, fill #1
  Filled 2021-08-27: qty 1.5, 42d supply, fill #2

## 2021-07-02 NOTE — Addendum Note (Signed)
Addended by: Clayborne Dana on: 07/02/2021 05:23 PM   Modules accepted: Orders

## 2021-07-04 ENCOUNTER — Other Ambulatory Visit (HOSPITAL_BASED_OUTPATIENT_CLINIC_OR_DEPARTMENT_OTHER): Payer: Self-pay

## 2021-07-05 ENCOUNTER — Other Ambulatory Visit (HOSPITAL_BASED_OUTPATIENT_CLINIC_OR_DEPARTMENT_OTHER): Payer: Self-pay

## 2021-07-16 ENCOUNTER — Other Ambulatory Visit (HOSPITAL_BASED_OUTPATIENT_CLINIC_OR_DEPARTMENT_OTHER): Payer: Self-pay

## 2021-07-16 ENCOUNTER — Encounter: Payer: Self-pay | Admitting: Family Medicine

## 2021-07-16 ENCOUNTER — Ambulatory Visit: Payer: BC Managed Care – PPO | Admitting: Family Medicine

## 2021-07-16 VITALS — BP 141/92 | HR 78 | Ht 64.0 in | Wt 203.2 lb

## 2021-07-16 DIAGNOSIS — R7989 Other specified abnormal findings of blood chemistry: Secondary | ICD-10-CM

## 2021-07-16 DIAGNOSIS — I10 Essential (primary) hypertension: Secondary | ICD-10-CM | POA: Diagnosis not present

## 2021-07-16 DIAGNOSIS — E669 Obesity, unspecified: Secondary | ICD-10-CM | POA: Insufficient documentation

## 2021-07-16 DIAGNOSIS — E119 Type 2 diabetes mellitus without complications: Secondary | ICD-10-CM | POA: Diagnosis not present

## 2021-07-16 NOTE — Progress Notes (Signed)
? ?Acute Office Visit ? ?Subjective:  ? ? Patient ID: Brandi Russo, female    DOB: 04/29/1986, 36 y.o.   MRN: [id number] ? ?CC: HTN f/u  ? ? ?HPI ?Patient is in today for elevated BP follow-up.  ? ?Patient established care about 2 weeks ago and BP was 155/95. She was instructed to monitor at home, keep a log, and return with home machine for follow-up. Additionally, on labs that day she had elevated platelets and A1c - sent in metformin and Ozempic.  ? ? ?ELEVATED BP READINGS: ?- Checking BP at home: yes, 130-140s/80-100 ?- Denies any SOB, recurrent headaches, CP, vision changes, LE edema, dizziness, palpitations, or medication side effects. ?- Diet: low sodium/carbs/sugars ?- Exercise: none ? ? ?DM: ?A1c 2 weeks ago was 8.9%. ?Metformin was sent in along with Ozempic (she has started both). Tolerating well, occasional mild nausea. She has taken 2 doses of Ozempic so far, appetite decreased, better portion control; already lost 7 lbs. She is very pleased.  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  ?She already has a glucometer. Fasting has been around 150s.  ? ? ?ELEVATED PLATELETS: ?2 weeks ago platelets were 424. ?No history, asymptomatic.  ?She would like to recheck labs at next 2 week f/u instead of today.  ? ? ? ? ? ? ?Past Medical History:  ?Diagnosis Date  ? ADHD   ? Gestational diabetes   ? History of gestational diabetes   ? ? ?Past Surgical History:  ?Procedure Laterality Date  ? NO PAST SURGERIES    ? ? ?Family History  ?Problem Relation Age of Onset  ? Hypertension Mother   ? Hypertension Father   ? Lung cancer Maternal Grandmother   ? ? ?Social History  ? ?Socioeconomic History  ? Marital status: Married  ?  Spouse name: Not on file  ? Number of children: Not on file  ? Years of education: Not on file  ? Highest education level: Not on file  ?Occupational History  ? Not on file  ?Tobacco Use  ? Smoking status: Never  ? Smokeless tobacco: Never  ?Vaping Use  ? Vaping Use: Never used  ?Substance and  Sexual Activity  ? Alcohol use: Yes  ? Drug use: Never  ? Sexual activity: Yes  ?  Birth control/protection: None  ?Other Topics Concern  ? Not on file  ?Social History Narrative  ? Not on file  ? ?Social Determinants of Health  ? ?Financial Resource Strain: Not on file  ?Food Insecurity: Not on file  ?Transportation Needs: Not on file  ?Physical Activity: Not on file  ?Stress: Not on file  ?Social Connections: Not on file  ?Intimate Partner Violence: Not on file  ? ? ?Outpatient Medications Prior to Visit  ?Medication Sig Dispense Refill  ? amphetamine-dextroamphetamine (ADDERALL XR) 30 MG 24 hr capsule Take 1 capsule (30 mg total) by mouth every morning. 30 capsule 0  ? amphetamine-dextroamphetamine (ADDERALL XR) 30 MG 24 hr capsule Take 1 capsule (30 mg total) by mouth every morning. 30 capsule 0  ? amphetamine-dextroamphetamine (ADDERALL XR) 30 MG 24 hr capsule Take 1 capsule (30 mg total) by mouth every morning. 30 capsule 0  ? amphetamine-dextroamphetamine (ADDERALL) 20 MG tablet Take 1 tablet (20 mg total) by mouth daily. 30 tablet 0  ? amphetamine-dextroamphetamine (ADDERALL) 20 MG tablet Take 1 tablet (20 mg total) by mouth daily. 30 tablet 0  ? amphetamine-dextroamphetamine (ADDERALL) 20 MG tablet Take 1 tablet (20 mg total)  by mouth daily. 30 tablet 0  ? metFORMIN (GLUCOPHAGE) 500 MG tablet Take 1 tablet (500 mg total) by mouth 2 (two) times daily with a meal. 180 tablet 3  ? Semaglutide,0.25 or 0.5MG /DOS, (OZEMPIC, 0.25 OR 0.5 MG/DOSE,) 2 MG/1.5ML SOPN Inject 0.25 mg into the skin once a week for 4 weeks then increase to 0.5 mg once a week 3 mL 1  ? ?No facility-administered medications prior to visit.  ? ? ?No Known Allergies ? ?Review of Systems ?All review of systems negative except what is listed in the HPI ? ?   ?Objective:  ?  ?Physical Exam ?Vitals reviewed.  ?Constitutional:   ?   Appearance: Normal appearance.  ?Cardiovascular:  ?   Rate and Rhythm: Normal rate and regular rhythm.  ?   Pulses:  Normal pulses.  ?   Heart sounds: Normal heart sounds.  ?Pulmonary:  ?   Effort: Pulmonary effort is normal.  ?   Breath sounds: Normal breath sounds.  ?Musculoskeletal:  ?   Cervical back: Normal range of motion and neck supple.  ?   Right lower leg: No edema.  ?   Left lower leg: No edema.  ?Skin: ?   General: Skin is warm and dry.  ?   Capillary Refill: Capillary refill takes less than 2 seconds.  ?Neurological:  ?   General: No focal deficit present.  ?   Mental Status: She is alert and oriented to person, place, and time. Mental status is at baseline.  ?Psychiatric:     ?   Mood and Affect: Mood normal.     ?   Behavior: Behavior normal.     ?   Thought Content: Thought content normal.     ?   Judgment: Judgment normal.  ? ? ?BP (!) 141/92   Pulse 78   Ht 5\' 4"  (1.626 m)   Wt 203 lb 3.2 oz (92.2 kg)   BMI 34.88 kg/m?  ?Wt Readings from Last 3 Encounters:  ?07/16/21 203 lb 3.2 oz (92.2 kg)  ?07/01/21 210 lb 9.6 oz (95.5 kg)  ?12/15/19 212 lb (96.2 kg)  ? ? ?Health Maintenance Due  ?Topic Date Due  ? FOOT EXAM  Never done  ? OPHTHALMOLOGY EXAM  Never done  ? URINE MICROALBUMIN  Never done  ? COVID-19 Vaccine (3 - Booster for Pfizer series) 03/09/2020  ? ? ?There are no preventive care reminders to display for this patient. ? ? ?Lab Results  ?Component Value Date  ? TSH 0.89 07/01/2021  ? ?Lab Results  ?Component Value Date  ? WBC 7.0 07/01/2021  ? HGB 14.0 07/01/2021  ? HCT 41.2 07/01/2021  ? MCV 85.0 07/01/2021  ? PLT 424.0 (H) 07/01/2021  ? ?Lab Results  ?Component Value Date  ? NA 140 07/01/2021  ? K 4.2 07/01/2021  ? CO2 32 07/01/2021  ? GLUCOSE 189 (H) 07/01/2021  ? BUN 11 07/01/2021  ? CREATININE 0.79 07/01/2021  ? BILITOT 0.6 07/01/2021  ? ALKPHOS 56 07/01/2021  ? AST 23 07/01/2021  ? ALT 32 07/01/2021  ? PROT 7.7 07/01/2021  ? ALBUMIN 4.4 07/01/2021  ? CALCIUM 9.8 07/01/2021  ? GFR 98.29 07/01/2021  ? ?Lab Results  ?Component Value Date  ? CHOL 152 07/01/2021  ? ?Lab Results  ?Component Value Date  ?  HDL 49.70 07/01/2021  ? ?Lab Results  ?Component Value Date  ? LDLCALC 75 07/01/2021  ? ?Lab Results  ?Component Value Date  ? TRIG 137.0 07/01/2021  ? ?  Lab Results  ?Component Value Date  ? CHOLHDL 3 07/01/2021  ? ?Lab Results  ?Component Value Date  ? HGBA1C 8.9 (H) 07/01/2021  ? ? ?   ?Assessment & Plan:  ? ?HTN (hypertension) ?Blood pressure is not at goal for age and co-morbidities.  I recommend medication, but patient prefers to wait another 2 weeks. ?In addition they were instructed on the following: ?- BP goal <130/80 ?- monitor and log blood pressures at home ?- check around the same time each day in a relaxed setting ?- Limit salt to <2000 mg/day ?- Follow DASH eating plan (heart healthy diet) ?- limit alcohol to 2 standard drinks per day for men and 1 per day for women ?- avoid tobacco products ?- get at least 2 hours of regular aerobic exercise weekly ?Patient aware of signs/symptoms requiring further/urgent evaluation. ?F/u in 2 weeks to recheck. Consider starting ACEi/ARB if still elevated. ? ?Obesity (BMI 35.0-39.9 without comorbidity) ?Already down 7 pounds since starting Ozempic 2-3 weeks ago. Continue Ozempic, portion control, healthy diet, exercise.  ? ?DM2 (diabetes mellitus, type 2) (HCC) ?Continue metformin, Ozempic, low-carb diet, portion control, daily exercise. Recheck A1c anytime after 09/28/21. Continue home glucose monitoring of fasting levels at least a few days per week. 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 ?Elevated platelet count ?She prefers to wait until next follow-up to do labs. No red flags. Asymptomatic.  ? ? ?Follow-up in 2 weeks for BP f/u and repeat labs.  ? ?Clayborne Dana, NP ? ?

## 2021-07-16 NOTE — Assessment & Plan Note (Signed)
Already down 7 pounds since starting Ozempic 2-3 weeks ago. Continue Ozempic, portion control, healthy diet, exercise.  ?

## 2021-07-16 NOTE — Assessment & Plan Note (Signed)
Continue metformin, Ozempic, low-carb diet, portion control, daily exercise. Recheck A1c anytime after 09/28/21. Continue home glucose monitoring of fasting levels at least a few days per week. 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

## 2021-07-16 NOTE — Assessment & Plan Note (Signed)
Blood pressure is not at goal for age and co-morbidities.  I recommend medication, but patient prefers to wait another 2 weeks. ?In addition they were instructed on the following: ?- BP goal <130/80 ?- monitor and log blood pressures at home ?- check around the same time each day in a relaxed setting ?- Limit salt to <2000 mg/day ?- Follow DASH eating plan (heart healthy diet) ?- limit alcohol to 2 standard drinks per day for men and 1 per day for women ?- avoid tobacco products ?- get at least 2 hours of regular aerobic exercise weekly ?Patient aware of signs/symptoms requiring further/urgent evaluation. ?F/u in 2 weeks to recheck. Consider starting ACEi/ARB if still elevated. ?

## 2021-07-19 ENCOUNTER — Other Ambulatory Visit (HOSPITAL_BASED_OUTPATIENT_CLINIC_OR_DEPARTMENT_OTHER): Payer: Self-pay

## 2021-07-19 MED ORDER — AMPHETAMINE-DEXTROAMPHETAMINE 20 MG PO TABS
20.0000 mg | ORAL_TABLET | Freq: Every day | ORAL | 0 refills | Status: DC
  Filled 2021-07-19: qty 30, 30d supply, fill #0

## 2021-07-19 MED ORDER — AMPHETAMINE-DEXTROAMPHET ER 30 MG PO CP24
30.0000 mg | ORAL_CAPSULE | Freq: Every morning | ORAL | 0 refills | Status: DC
  Filled 2021-07-19 – 2021-07-23 (×2): qty 30, 30d supply, fill #0

## 2021-07-23 ENCOUNTER — Other Ambulatory Visit (HOSPITAL_BASED_OUTPATIENT_CLINIC_OR_DEPARTMENT_OTHER): Payer: Self-pay

## 2021-07-26 NOTE — Addendum Note (Signed)
Addended by: Mervin Kung A on: 07/26/2021 03:12 PM ? ? Modules accepted: Orders ? ?

## 2021-07-30 ENCOUNTER — Other Ambulatory Visit (INDEPENDENT_AMBULATORY_CARE_PROVIDER_SITE_OTHER): Payer: BC Managed Care – PPO

## 2021-07-30 DIAGNOSIS — R7989 Other specified abnormal findings of blood chemistry: Secondary | ICD-10-CM

## 2021-07-31 LAB — PATHOLOGIST SMEAR REVIEW

## 2021-07-31 LAB — CBC WITH DIFFERENTIAL/PLATELET
Absolute Monocytes: 441 cells/uL (ref 200–950)
Basophils Absolute: 30 cells/uL (ref 0–200)
Basophils Relative: 0.4 %
Eosinophils Absolute: 175 cells/uL (ref 15–500)
Eosinophils Relative: 2.3 %
HCT: 45.3 % — ABNORMAL HIGH (ref 35.0–45.0)
Hemoglobin: 15.1 g/dL (ref 11.7–15.5)
Lymphs Abs: 2789 cells/uL (ref 850–3900)
MCH: 28.5 pg (ref 27.0–33.0)
MCHC: 33.3 g/dL (ref 32.0–36.0)
MCV: 85.6 fL (ref 80.0–100.0)
MPV: 9.8 fL (ref 7.5–12.5)
Monocytes Relative: 5.8 %
Neutro Abs: 4165 cells/uL (ref 1500–7800)
Neutrophils Relative %: 54.8 %
Platelets: 286 10*3/uL (ref 140–400)
RBC: 5.29 10*6/uL — ABNORMAL HIGH (ref 3.80–5.10)
RDW: 13 % (ref 11.0–15.0)
Total Lymphocyte: 36.7 %
WBC: 7.6 10*3/uL (ref 3.8–10.8)

## 2021-08-01 ENCOUNTER — Other Ambulatory Visit (HOSPITAL_BASED_OUTPATIENT_CLINIC_OR_DEPARTMENT_OTHER): Payer: Self-pay

## 2021-08-02 ENCOUNTER — Other Ambulatory Visit (HOSPITAL_BASED_OUTPATIENT_CLINIC_OR_DEPARTMENT_OTHER): Payer: Self-pay

## 2021-08-06 ENCOUNTER — Other Ambulatory Visit (HOSPITAL_BASED_OUTPATIENT_CLINIC_OR_DEPARTMENT_OTHER): Payer: Self-pay

## 2021-08-07 ENCOUNTER — Other Ambulatory Visit (HOSPITAL_BASED_OUTPATIENT_CLINIC_OR_DEPARTMENT_OTHER): Payer: Self-pay

## 2021-08-20 IMAGING — US US MFM FETAL BPP W/O NON-STRESS
1 series · 15 of 23 positions shown · non-contrast
Comparison: none

[Series 1: us mfm fetal bpp w/o non-stress · 23 acquisitions, 15 frames shown]
[im 1/23]
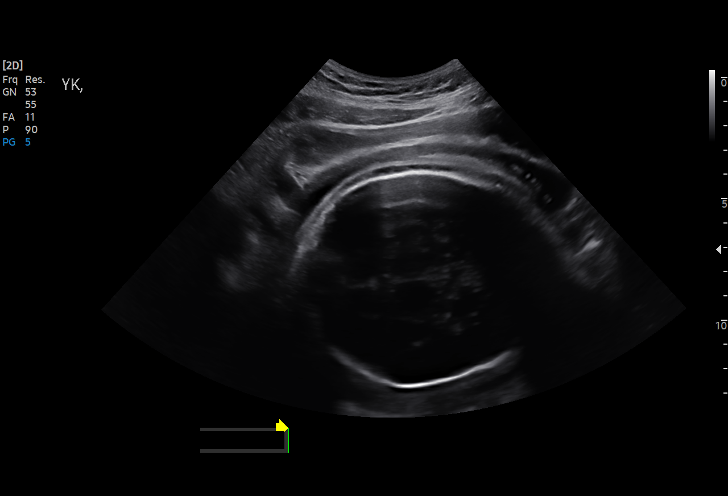
[im 3/23]
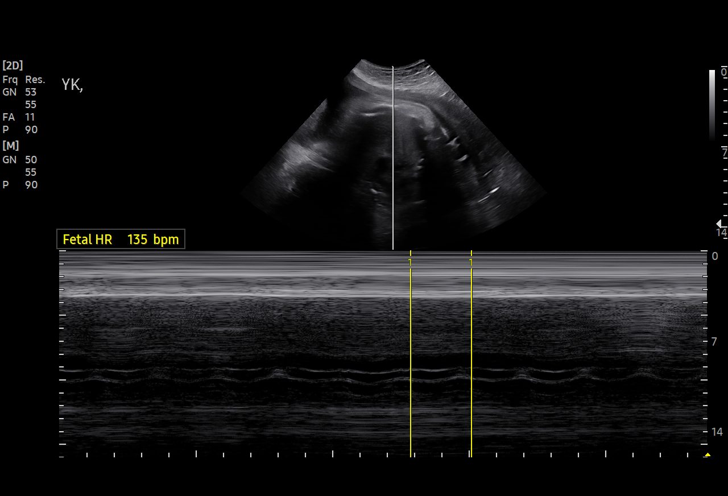
[im 4/23]
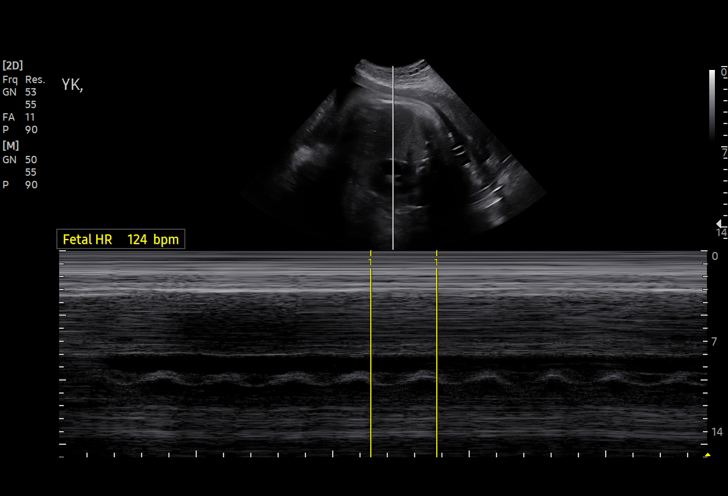
[im 6/23]
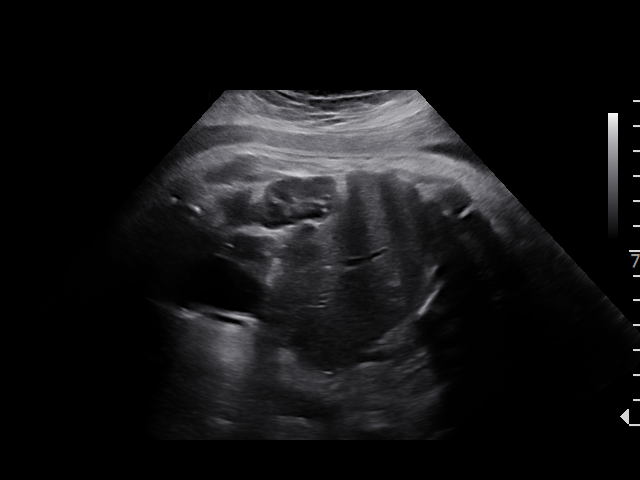
[im 7/23]
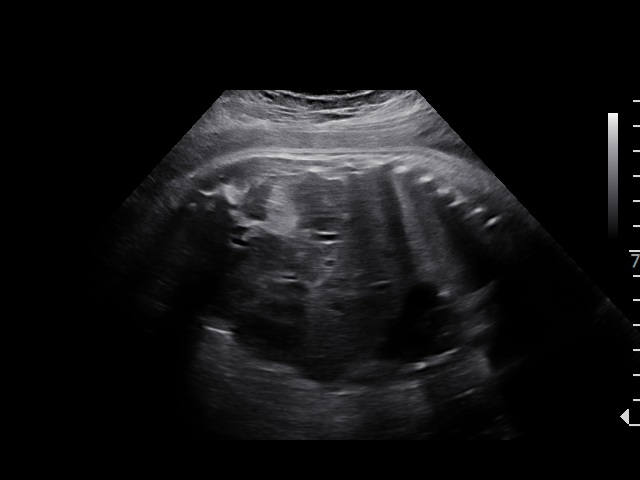
[im 9/23]
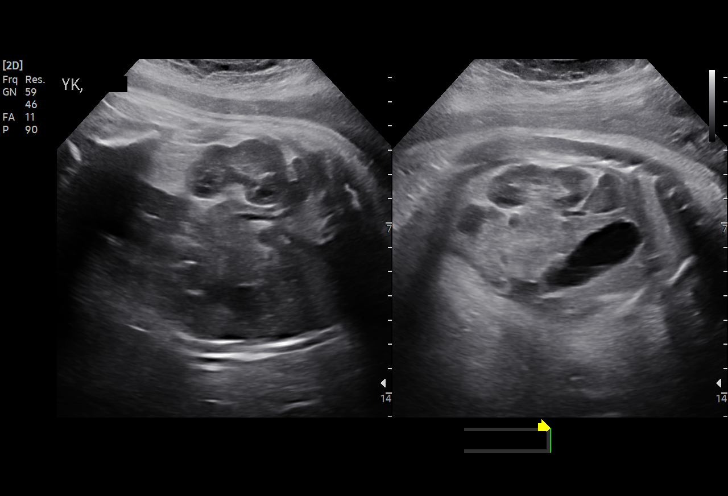
[im 10/23]
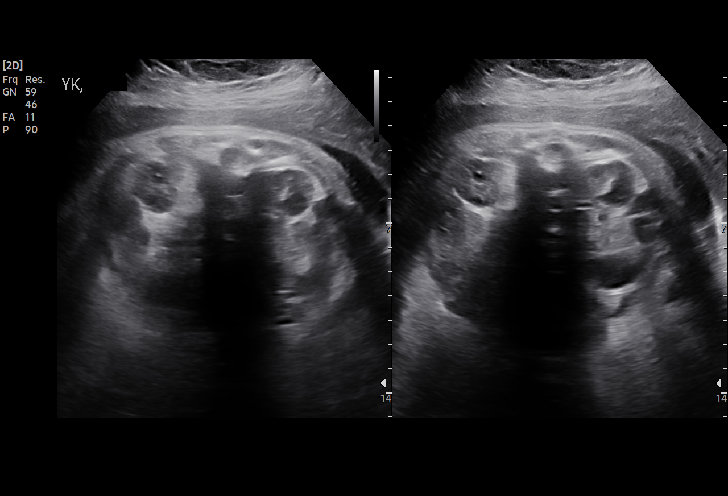
[im 12/23]
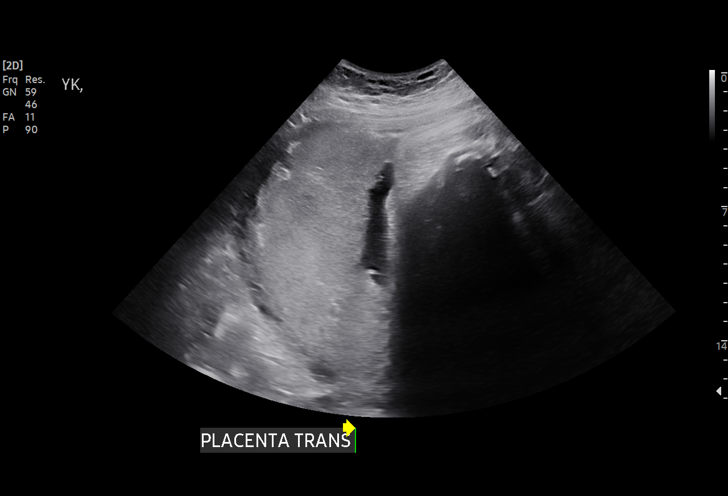
[im 14/23]
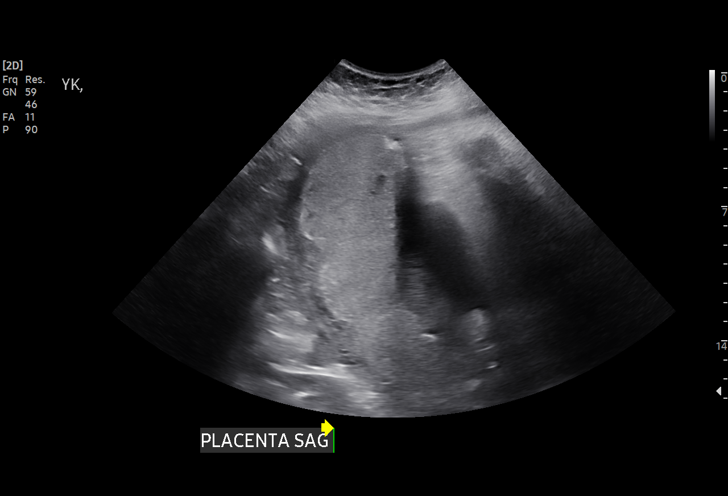
[im 15/23]
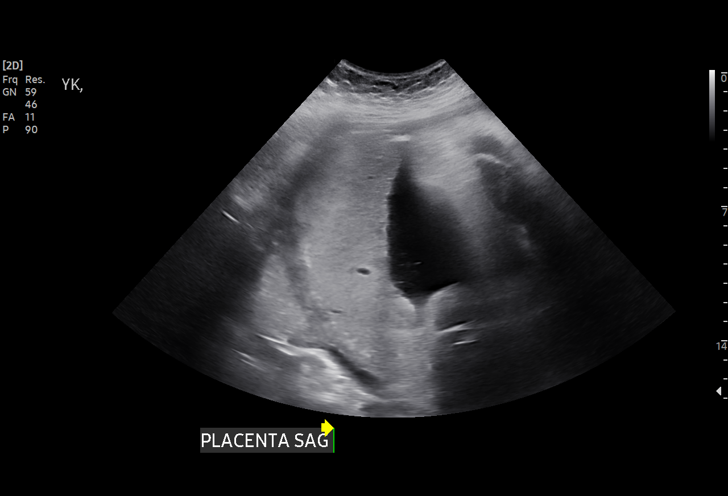
[im 17/23]
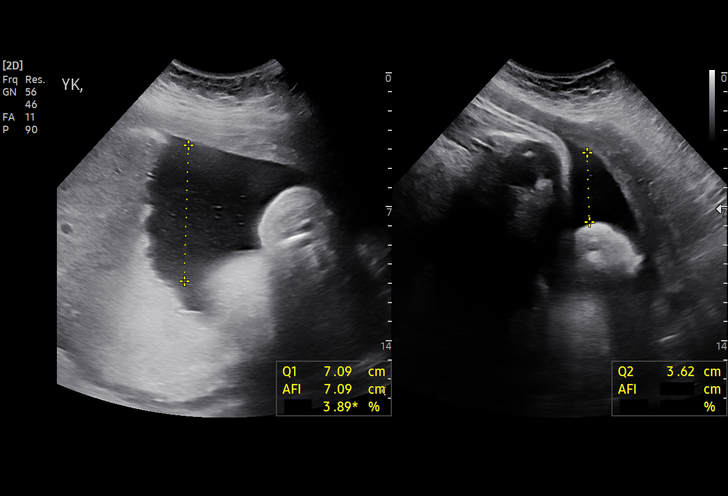
[im 18/23]
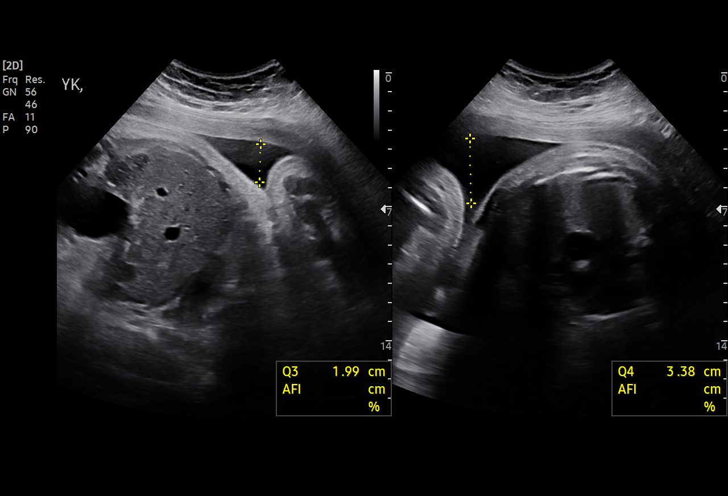
[im 20/23]
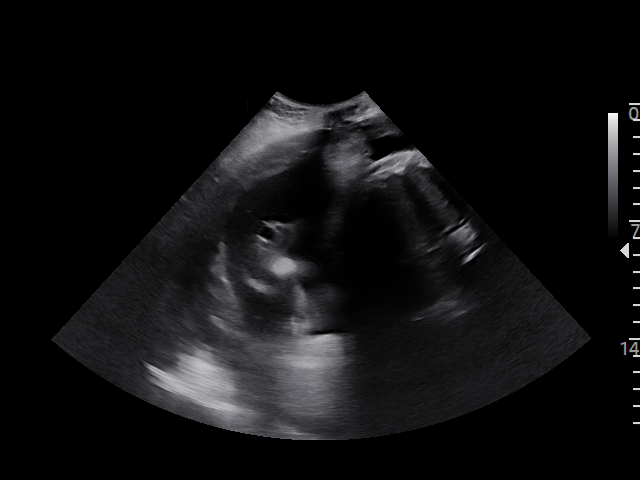
[im 21/23]
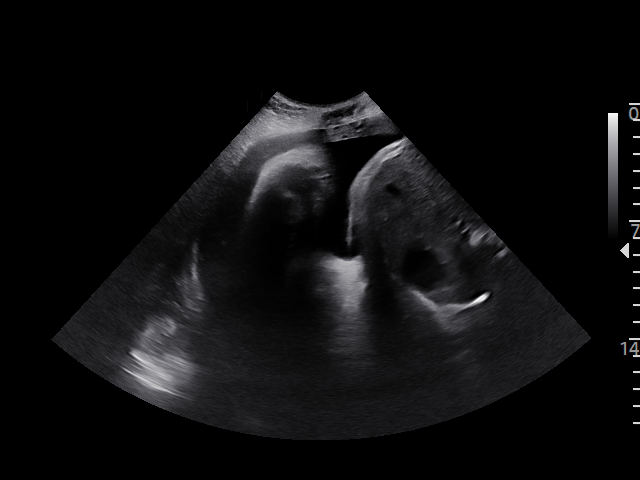
[im 23/23]
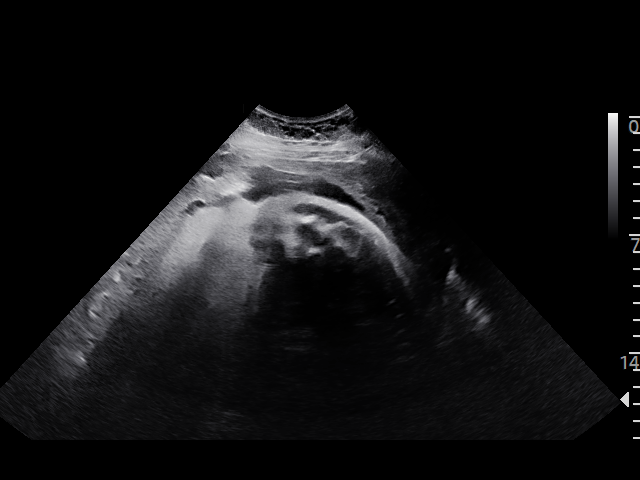

[15 of 23 positions shown; findings below may reference images not displayed]

GEOVANNY BRUNO

Indications

 Encounter for other antenatal screening
 follow-up
 Gestational diabetes in pregnancy,
 controlled by oral hypoglycemic drugs
 Metformin
 Low Risk NIPS(Negative Quad)(WNL
 ECHO)(Negative CF)
 36 weeks gestation of pregnancy
Vital Signs

                                                Height:        5'4"
Fetal Evaluation

 Num Of Fetuses:         1
 Fetal Heart Rate(bpm):  148
 Cardiac Activity:       Observed
 Presentation:           Cephalic
 Placenta:               Posterior

 Amniotic Fluid
 AFI FV:      Within normal limits

 AFI Sum(cm)     %Tile       Largest Pocket(cm)
 16.1            61

 RUQ(cm)       RLQ(cm)       LUQ(cm)        LLQ(cm)
 7.1           3.4           3.6            2
Biophysical Evaluation

 Amniotic F.V:   Within normal limits       F. Tone:        Observed
 F. Movement:    Observed                   Score:          [DATE]
 F. Breathing:   Observed
OB History

 Gravidity:    2         Term:   1
 Living:       1
Gestational Age

 LMP:           36w 6d        Date:  01/19/19                 EDD:   10/26/19
 Best:          36w 6d     Det. By:  LMP  (01/19/19)          EDD:   10/26/19
Anatomy

 Stomach:               Appears normal, left   Bladder:                Appears normal
                        sided
 Kidneys:               Appear normal
Impression

 Gestational diabetes. Patient reports her fasting levels have
 been within normal range after increase in metformin dosage
 (1,000 mg at night and 500 mg in the morning).
 Amniotic fluid is normal and good fetal activity is seen
 .Antenatal testing is reassuring. BPP [DATE].
Recommendations

 -Fetal growth assessment next week.
 -Continue weekly BPP till delivery.
 -Delivery at 39 weeks or at 38 weeks if control of diabetes is
 suboptimal.
                 Sedighi, Stig Arne

## 2021-08-27 ENCOUNTER — Other Ambulatory Visit (HOSPITAL_BASED_OUTPATIENT_CLINIC_OR_DEPARTMENT_OTHER): Payer: Self-pay

## 2021-09-03 ENCOUNTER — Encounter: Payer: Self-pay | Admitting: Family Medicine

## 2021-09-04 ENCOUNTER — Other Ambulatory Visit: Payer: Self-pay | Admitting: Family Medicine

## 2021-09-04 ENCOUNTER — Other Ambulatory Visit (HOSPITAL_BASED_OUTPATIENT_CLINIC_OR_DEPARTMENT_OTHER): Payer: Self-pay

## 2021-09-04 DIAGNOSIS — E119 Type 2 diabetes mellitus without complications: Secondary | ICD-10-CM

## 2021-09-04 MED ORDER — OZEMPIC (1 MG/DOSE) 4 MG/3ML ~~LOC~~ SOPN
1.0000 mg | PEN_INJECTOR | SUBCUTANEOUS | 1 refills | Status: DC
  Filled 2021-09-04: qty 3, 28d supply, fill #0
  Filled 2021-10-01: qty 3, 28d supply, fill #1

## 2021-10-01 ENCOUNTER — Other Ambulatory Visit (HOSPITAL_BASED_OUTPATIENT_CLINIC_OR_DEPARTMENT_OTHER): Payer: Self-pay

## 2021-10-02 ENCOUNTER — Other Ambulatory Visit (HOSPITAL_BASED_OUTPATIENT_CLINIC_OR_DEPARTMENT_OTHER): Payer: Self-pay

## 2021-10-02 DIAGNOSIS — F909 Attention-deficit hyperactivity disorder, unspecified type: Secondary | ICD-10-CM | POA: Diagnosis not present

## 2021-10-02 MED ORDER — AMPHETAMINE-DEXTROAMPHETAMINE 20 MG PO TABS
ORAL_TABLET | ORAL | 0 refills | Status: DC
  Filled 2021-12-04: qty 30, 30d supply, fill #0

## 2021-10-02 MED ORDER — AMPHETAMINE-DEXTROAMPHETAMINE 20 MG PO TABS
ORAL_TABLET | ORAL | 0 refills | Status: DC
  Filled 2021-11-04: qty 30, 30d supply, fill #0

## 2021-10-02 MED ORDER — AMPHETAMINE-DEXTROAMPHETAMINE 20 MG PO TABS
ORAL_TABLET | ORAL | 0 refills | Status: DC
  Filled 2021-10-02: qty 30, 30d supply, fill #0

## 2021-10-02 MED ORDER — AMPHETAMINE-DEXTROAMPHET ER 30 MG PO CP24
ORAL_CAPSULE | ORAL | 0 refills | Status: DC
  Filled 2021-12-04: qty 30, 30d supply, fill #0

## 2021-10-02 MED ORDER — AMPHETAMINE-DEXTROAMPHET ER 30 MG PO CP24
ORAL_CAPSULE | ORAL | 0 refills | Status: DC
  Filled 2021-10-02: qty 30, 30d supply, fill #0

## 2021-10-02 MED ORDER — AMPHETAMINE-DEXTROAMPHET ER 30 MG PO CP24
ORAL_CAPSULE | ORAL | 0 refills | Status: DC
  Filled 2021-11-04: qty 30, 30d supply, fill #0

## 2021-10-07 ENCOUNTER — Ambulatory Visit: Payer: BC Managed Care – PPO | Admitting: Family Medicine

## 2021-10-08 ENCOUNTER — Other Ambulatory Visit (HOSPITAL_BASED_OUTPATIENT_CLINIC_OR_DEPARTMENT_OTHER): Payer: Self-pay

## 2021-10-08 ENCOUNTER — Encounter: Payer: Self-pay | Admitting: Family Medicine

## 2021-10-08 ENCOUNTER — Ambulatory Visit: Payer: BC Managed Care – PPO | Admitting: Family Medicine

## 2021-10-08 VITALS — BP 125/79 | HR 91 | Ht 64.0 in | Wt 196.6 lb

## 2021-10-08 DIAGNOSIS — E119 Type 2 diabetes mellitus without complications: Secondary | ICD-10-CM

## 2021-10-08 DIAGNOSIS — R030 Elevated blood-pressure reading, without diagnosis of hypertension: Secondary | ICD-10-CM | POA: Diagnosis not present

## 2021-10-08 LAB — COMPREHENSIVE METABOLIC PANEL
ALT: 16 U/L (ref 0–35)
AST: 15 U/L (ref 0–37)
Albumin: 4.6 g/dL (ref 3.5–5.2)
Alkaline Phosphatase: 46 U/L (ref 39–117)
BUN: 13 mg/dL (ref 6–23)
CO2: 25 mEq/L (ref 19–32)
Calcium: 9.6 mg/dL (ref 8.4–10.5)
Chloride: 103 mEq/L (ref 96–112)
Creatinine, Ser: 0.79 mg/dL (ref 0.40–1.20)
GFR: 98.11 mL/min (ref >60.00)
Glucose, Bld: 113 mg/dL — ABNORMAL HIGH (ref 70–99)
Potassium: 4.5 mEq/L (ref 3.5–5.1)
Sodium: 139 mEq/L (ref 135–145)
Total Bilirubin: 0.4 mg/dL (ref 0.2–1.2)
Total Protein: 7.6 g/dL (ref 6.0–8.3)

## 2021-10-08 LAB — HEMOGLOBIN A1C: Hgb A1c MFr Bld: 6.2 % (ref 4.6–6.5)

## 2021-10-08 LAB — MICROALBUMIN / CREATININE URINE RATIO
Creatinine,U: 130.7 mg/dL
Microalb Creat Ratio: 5.2 mg/g (ref 0.0–30.0)
Microalb, Ur: 6.8 mg/dL — ABNORMAL HIGH (ref 0.0–1.9)

## 2021-10-08 MED ORDER — FREESTYLE LIBRE 3 SENSOR MISC
1.0000 | 11 refills | Status: AC
  Filled 2021-10-08 – 2021-10-29 (×2): qty 2, 28d supply, fill #0
  Filled 2021-11-21: qty 2, 28d supply, fill #1
  Filled 2021-12-27: qty 2, 28d supply, fill #2
  Filled 2022-02-22: qty 2, 28d supply, fill #3
  Filled 2022-04-14: qty 2, 28d supply, fill #4
  Filled 2022-05-23: qty 2, 28d supply, fill #5
  Filled 2022-06-18: qty 2, 28d supply, fill #6
  Filled 2022-07-16: qty 2, 28d supply, fill #7
  Filled 2022-08-19: qty 2, 28d supply, fill #8

## 2021-10-08 NOTE — Progress Notes (Signed)
Established Patient Office Visit  Subjective   Patient ID: Brandi Russo, female    DOB: 1986-01-01  Age: 36 y.o. MRN: [id number]  CC: DM, BP f/u    HPI Patient here for follow-up. Reports she is doing really well overall. No new concerns.   Elevated blood pressure readings: - Medications: none - Compliance: n/a, lifestyle measures only - Checking BP at home: rarely - Denies any SOB, recurrent headaches, CP, vision changes, LE edema, dizziness, palpitations, or medication side effects. - Diet: heart healthy, low-carb - Exercise: home exercises, Peloton 3-4x/week    DIABETES: - Checking BG at home: occasionally, fasting is usually 100-110s - Medications: Ozempic 1 mg weekly (currently on week 5), metformin 500 mg BID - Compliance: good - Diet: low carb diet  - Exercise: Peloton 3-4x/week - Microalbumin: today - Denies symptoms of hypoglycemia, polyuria, polydipsia, numbness extremities, foot ulcers/trauma, wounds that are not healing, medication side effects  - Starting to feel a big difference in how her clothes fit. She has lost about 14 pounds since February and she is not having her weight bounce around as much as before.  - She would like to consider increasing Ozempic at next refill if labs are stable.           ROS All review of systems negative except what is listed in the HPI    Objective:     BP 125/79   Pulse 91   Ht 5\' 4"  (1.626 m)   Wt 196 lb 9.6 oz (89.2 kg)   BMI 33.75 kg/m    Physical Exam Vitals reviewed.  Constitutional:      General: She is not in acute distress.    Appearance: Normal appearance. She is obese. She is not ill-appearing.  Cardiovascular:     Rate and Rhythm: Normal rate and regular rhythm.  Pulmonary:     Effort: Pulmonary effort is normal.     Breath sounds: Normal breath sounds.  Musculoskeletal:     Cervical back: Normal range of motion and neck supple. No tenderness.     Right lower leg: No edema.     Left lower  leg: No edema.  Lymphadenopathy:     Cervical: No cervical adenopathy.  Skin:    General: Skin is warm and dry.  Neurological:     General: No focal deficit present.     Mental Status: She is alert and oriented to person, place, and time. Mental status is at baseline.  Psychiatric:        Mood and Affect: Mood normal.        Behavior: Behavior normal.        Thought Content: Thought content normal.        Judgment: Judgment normal.     No results found for any visits on 10/08/21.    The ASCVD Risk score (Arnett DK, et al., 2019) failed to calculate for the following reasons:   The 2019 ASCVD risk score is only valid for ages 83 to 8    Assessment & Plan:   1. Type 2 diabetes mellitus without complication, without long-term current use of insulin (HCC) Uncontrolled with last A1c 8.9% Continue current medications - metformin 500 mg BID and Ozempic 1 mg weekly UTD on vaccines, eye exam, foot exam Discussed diet and exercise F/u in 3-6 months pending labs Can increase Ozempic at next refill if labs are stable She would like to see if insurance would cover a continuous monitor - aware that it  may not since she is not on insulin. If not, continuing monitoring fasting glucose at home.   - Continuous Blood Gluc Sensor (FREESTYLE LIBRE 3 SENSOR) MISC; 1 each by Does not apply route every 14 (fourteen) days. Place 1 sensor on the skin every 14 days. Use to check glucose continuously  Dispense: 2 each; Refill: 11 - Hemoglobin A1c - Comprehensive metabolic panel - Microalbumin / creatinine urine ratio  2. Elevated blood pressure reading BP has stabilized with lifestyle measures and weight loss. No new symptoms. Continue healthy diet and exercise. Monitor occasionally at work/home.     Return in about 5 months (around 03/10/2022) for DM f/u .    Clayborne Dana, NP

## 2021-10-08 NOTE — Patient Instructions (Signed)
Updating labs today - we will let you know results. If everything is stable, we can increase your Ozempic at your next refill - send [place] a message when you are getting close to needing it.

## 2021-10-10 ENCOUNTER — Other Ambulatory Visit (HOSPITAL_BASED_OUTPATIENT_CLINIC_OR_DEPARTMENT_OTHER): Payer: Self-pay

## 2021-10-15 ENCOUNTER — Other Ambulatory Visit (HOSPITAL_BASED_OUTPATIENT_CLINIC_OR_DEPARTMENT_OTHER): Payer: Self-pay

## 2021-10-18 ENCOUNTER — Other Ambulatory Visit (HOSPITAL_BASED_OUTPATIENT_CLINIC_OR_DEPARTMENT_OTHER): Payer: Self-pay

## 2021-10-28 ENCOUNTER — Encounter: Payer: Self-pay | Admitting: Family Medicine

## 2021-10-28 ENCOUNTER — Other Ambulatory Visit (HOSPITAL_BASED_OUTPATIENT_CLINIC_OR_DEPARTMENT_OTHER): Payer: Self-pay

## 2021-10-28 DIAGNOSIS — E119 Type 2 diabetes mellitus without complications: Secondary | ICD-10-CM

## 2021-10-28 DIAGNOSIS — E669 Obesity, unspecified: Secondary | ICD-10-CM

## 2021-10-28 MED ORDER — SEMAGLUTIDE (2 MG/DOSE) 8 MG/3ML ~~LOC~~ SOPN
2.0000 mg | PEN_INJECTOR | SUBCUTANEOUS | 2 refills | Status: DC
  Filled 2021-10-28: qty 3, 28d supply, fill #0
  Filled 2021-11-21: qty 3, 28d supply, fill #1
  Filled 2021-12-27: qty 3, 28d supply, fill #2

## 2021-10-29 ENCOUNTER — Other Ambulatory Visit (HOSPITAL_BASED_OUTPATIENT_CLINIC_OR_DEPARTMENT_OTHER): Payer: Self-pay

## 2021-11-01 ENCOUNTER — Other Ambulatory Visit (HOSPITAL_BASED_OUTPATIENT_CLINIC_OR_DEPARTMENT_OTHER): Payer: Self-pay

## 2021-11-04 ENCOUNTER — Other Ambulatory Visit (HOSPITAL_BASED_OUTPATIENT_CLINIC_OR_DEPARTMENT_OTHER): Payer: Self-pay

## 2021-11-21 ENCOUNTER — Other Ambulatory Visit (HOSPITAL_BASED_OUTPATIENT_CLINIC_OR_DEPARTMENT_OTHER): Payer: Self-pay

## 2021-11-25 ENCOUNTER — Other Ambulatory Visit (HOSPITAL_BASED_OUTPATIENT_CLINIC_OR_DEPARTMENT_OTHER): Payer: Self-pay

## 2021-11-29 ENCOUNTER — Other Ambulatory Visit (HOSPITAL_BASED_OUTPATIENT_CLINIC_OR_DEPARTMENT_OTHER): Payer: Self-pay

## 2021-12-03 ENCOUNTER — Other Ambulatory Visit (HOSPITAL_BASED_OUTPATIENT_CLINIC_OR_DEPARTMENT_OTHER): Payer: Self-pay

## 2021-12-04 ENCOUNTER — Other Ambulatory Visit (HOSPITAL_BASED_OUTPATIENT_CLINIC_OR_DEPARTMENT_OTHER): Payer: Self-pay

## 2021-12-19 ENCOUNTER — Other Ambulatory Visit (HOSPITAL_BASED_OUTPATIENT_CLINIC_OR_DEPARTMENT_OTHER): Payer: Self-pay

## 2021-12-19 DIAGNOSIS — F909 Attention-deficit hyperactivity disorder, unspecified type: Secondary | ICD-10-CM | POA: Diagnosis not present

## 2021-12-19 MED ORDER — AMPHETAMINE-DEXTROAMPHET ER 30 MG PO CP24
30.0000 mg | ORAL_CAPSULE | Freq: Every morning | ORAL | 0 refills | Status: DC
  Filled 2022-01-31: qty 30, 30d supply, fill #0

## 2021-12-19 MED ORDER — AMPHETAMINE-DEXTROAMPHETAMINE 20 MG PO TABS
20.0000 mg | ORAL_TABLET | Freq: Every day | ORAL | 0 refills | Status: DC
  Filled 2022-01-31: qty 30, 30d supply, fill #0

## 2021-12-19 MED ORDER — AMPHETAMINE-DEXTROAMPHETAMINE 20 MG PO TABS
20.0000 mg | ORAL_TABLET | Freq: Every day | ORAL | 0 refills | Status: DC
  Filled 2022-01-31 – 2022-03-03 (×2): qty 30, 30d supply, fill #0

## 2021-12-19 MED ORDER — AMPHETAMINE-DEXTROAMPHET ER 30 MG PO CP24
30.0000 mg | ORAL_CAPSULE | Freq: Every morning | ORAL | 0 refills | Status: DC
  Filled 2022-03-03: qty 30, 30d supply, fill #0

## 2021-12-19 MED ORDER — AMPHETAMINE-DEXTROAMPHET ER 30 MG PO CP24
30.0000 mg | ORAL_CAPSULE | Freq: Every morning | ORAL | 0 refills | Status: DC
  Filled 2022-01-02: qty 30, 30d supply, fill #0

## 2021-12-19 MED ORDER — AMPHETAMINE-DEXTROAMPHETAMINE 20 MG PO TABS
20.0000 mg | ORAL_TABLET | Freq: Every day | ORAL | 0 refills | Status: DC
  Filled 2022-01-02: qty 30, 30d supply, fill #0

## 2021-12-27 ENCOUNTER — Other Ambulatory Visit (HOSPITAL_BASED_OUTPATIENT_CLINIC_OR_DEPARTMENT_OTHER): Payer: Self-pay

## 2022-01-02 ENCOUNTER — Other Ambulatory Visit (HOSPITAL_BASED_OUTPATIENT_CLINIC_OR_DEPARTMENT_OTHER): Payer: Self-pay

## 2022-01-03 ENCOUNTER — Other Ambulatory Visit (HOSPITAL_BASED_OUTPATIENT_CLINIC_OR_DEPARTMENT_OTHER): Payer: Self-pay

## 2022-01-08 ENCOUNTER — Other Ambulatory Visit (HOSPITAL_BASED_OUTPATIENT_CLINIC_OR_DEPARTMENT_OTHER): Payer: Self-pay

## 2022-01-25 ENCOUNTER — Other Ambulatory Visit: Payer: Self-pay | Admitting: Family Medicine

## 2022-01-25 DIAGNOSIS — E119 Type 2 diabetes mellitus without complications: Secondary | ICD-10-CM

## 2022-01-25 DIAGNOSIS — E669 Obesity, unspecified: Secondary | ICD-10-CM

## 2022-01-27 ENCOUNTER — Other Ambulatory Visit (HOSPITAL_BASED_OUTPATIENT_CLINIC_OR_DEPARTMENT_OTHER): Payer: Self-pay

## 2022-01-27 MED ORDER — OZEMPIC (2 MG/DOSE) 8 MG/3ML ~~LOC~~ SOPN
2.0000 mg | PEN_INJECTOR | SUBCUTANEOUS | 1 refills | Status: DC
  Filled 2022-01-27: qty 3, 28d supply, fill #0
  Filled 2022-02-22: qty 3, 28d supply, fill #1

## 2022-01-31 ENCOUNTER — Other Ambulatory Visit (HOSPITAL_BASED_OUTPATIENT_CLINIC_OR_DEPARTMENT_OTHER): Payer: Self-pay

## 2022-02-25 ENCOUNTER — Other Ambulatory Visit (HOSPITAL_BASED_OUTPATIENT_CLINIC_OR_DEPARTMENT_OTHER): Payer: Self-pay

## 2022-02-28 ENCOUNTER — Other Ambulatory Visit (HOSPITAL_BASED_OUTPATIENT_CLINIC_OR_DEPARTMENT_OTHER): Payer: Self-pay

## 2022-03-03 ENCOUNTER — Other Ambulatory Visit (HOSPITAL_BASED_OUTPATIENT_CLINIC_OR_DEPARTMENT_OTHER): Payer: Self-pay

## 2022-03-04 ENCOUNTER — Encounter: Payer: Self-pay | Admitting: Family Medicine

## 2022-03-04 ENCOUNTER — Other Ambulatory Visit (HOSPITAL_BASED_OUTPATIENT_CLINIC_OR_DEPARTMENT_OTHER): Payer: Self-pay

## 2022-03-19 ENCOUNTER — Other Ambulatory Visit (HOSPITAL_BASED_OUTPATIENT_CLINIC_OR_DEPARTMENT_OTHER): Payer: Self-pay

## 2022-03-19 DIAGNOSIS — F909 Attention-deficit hyperactivity disorder, unspecified type: Secondary | ICD-10-CM | POA: Diagnosis not present

## 2022-03-19 MED ORDER — AMPHETAMINE-DEXTROAMPHETAMINE 20 MG PO TABS
20.0000 mg | ORAL_TABLET | Freq: Every day | ORAL | 0 refills | Status: DC
  Filled 2022-05-08 – 2022-05-15 (×2): qty 30, 30d supply, fill #0
  Filled ????-??-??: fill #0

## 2022-03-19 MED ORDER — AMPHETAMINE-DEXTROAMPHET ER 30 MG PO CP24
30.0000 mg | ORAL_CAPSULE | Freq: Every morning | ORAL | 0 refills | Status: DC
  Filled 2022-03-31: qty 30, 30d supply, fill #0

## 2022-03-19 MED ORDER — AMPHETAMINE-DEXTROAMPHETAMINE 20 MG PO TABS
20.0000 mg | ORAL_TABLET | Freq: Every day | ORAL | 0 refills | Status: DC
  Filled 2022-03-31 (×2): qty 30, 30d supply, fill #0

## 2022-03-19 MED ORDER — AMPHETAMINE-DEXTROAMPHET ER 30 MG PO CP24: 30.0000 mg | ORAL_CAPSULE | Freq: Every morning | ORAL | 0 refills | Status: DC

## 2022-03-19 MED ORDER — AMPHETAMINE-DEXTROAMPHET ER 30 MG PO CP24
30.0000 mg | ORAL_CAPSULE | Freq: Every morning | ORAL | 0 refills | Status: DC
  Filled 2022-05-08: qty 30, 30d supply, fill #0

## 2022-03-19 MED ORDER — AMPHETAMINE-DEXTROAMPHETAMINE 20 MG PO TABS: 20.0000 mg | ORAL_TABLET | Freq: Every day | ORAL | 0 refills | Status: DC

## 2022-03-27 ENCOUNTER — Other Ambulatory Visit: Payer: Self-pay | Admitting: Family Medicine

## 2022-03-27 DIAGNOSIS — E669 Obesity, unspecified: Secondary | ICD-10-CM

## 2022-03-27 DIAGNOSIS — E119 Type 2 diabetes mellitus without complications: Secondary | ICD-10-CM

## 2022-03-31 ENCOUNTER — Other Ambulatory Visit (HOSPITAL_BASED_OUTPATIENT_CLINIC_OR_DEPARTMENT_OTHER): Payer: Self-pay

## 2022-03-31 MED ORDER — OZEMPIC (2 MG/DOSE) 8 MG/3ML ~~LOC~~ SOPN
2.0000 mg | PEN_INJECTOR | SUBCUTANEOUS | 1 refills | Status: DC
  Filled 2022-03-31: qty 3, 28d supply, fill #0
  Filled 2022-04-14 – 2022-04-24 (×2): qty 3, 28d supply, fill #1

## 2022-04-14 ENCOUNTER — Other Ambulatory Visit: Payer: Self-pay

## 2022-04-14 ENCOUNTER — Other Ambulatory Visit (HOSPITAL_BASED_OUTPATIENT_CLINIC_OR_DEPARTMENT_OTHER): Payer: Self-pay

## 2022-04-16 ENCOUNTER — Encounter: Payer: Self-pay | Admitting: Family Medicine

## 2022-04-22 ENCOUNTER — Other Ambulatory Visit (HOSPITAL_BASED_OUTPATIENT_CLINIC_OR_DEPARTMENT_OTHER): Payer: Self-pay

## 2022-04-30 ENCOUNTER — Other Ambulatory Visit (HOSPITAL_BASED_OUTPATIENT_CLINIC_OR_DEPARTMENT_OTHER): Payer: Self-pay

## 2022-05-07 ENCOUNTER — Other Ambulatory Visit (HOSPITAL_BASED_OUTPATIENT_CLINIC_OR_DEPARTMENT_OTHER): Payer: Self-pay

## 2022-05-08 ENCOUNTER — Other Ambulatory Visit (HOSPITAL_BASED_OUTPATIENT_CLINIC_OR_DEPARTMENT_OTHER): Payer: Self-pay

## 2022-05-13 ENCOUNTER — Other Ambulatory Visit (HOSPITAL_BASED_OUTPATIENT_CLINIC_OR_DEPARTMENT_OTHER): Payer: Self-pay

## 2022-05-15 ENCOUNTER — Encounter: Payer: Self-pay | Admitting: Family Medicine

## 2022-05-15 ENCOUNTER — Ambulatory Visit: Payer: BC Managed Care – PPO | Admitting: Family Medicine

## 2022-05-15 ENCOUNTER — Other Ambulatory Visit (HOSPITAL_BASED_OUTPATIENT_CLINIC_OR_DEPARTMENT_OTHER): Payer: Self-pay

## 2022-05-15 VITALS — BP 125/76 | HR 82 | Temp 98.0°F | Resp 16 | Ht 64.0 in | Wt 173.0 lb

## 2022-05-15 DIAGNOSIS — E119 Type 2 diabetes mellitus without complications: Secondary | ICD-10-CM | POA: Diagnosis not present

## 2022-05-15 LAB — CBC WITH DIFFERENTIAL/PLATELET
Basophils Absolute: 0 10*3/uL (ref 0.0–0.1)
Basophils Relative: 0.3 % (ref 0.0–3.0)
Eosinophils Absolute: 0.1 10*3/uL (ref 0.0–0.7)
Eosinophils Relative: 2.4 % (ref 0.0–5.0)
HCT: 41.8 % (ref 36.0–46.0)
Hemoglobin: 14.3 g/dL (ref 12.0–15.0)
Lymphocytes Relative: 34.4 % (ref 12.0–46.0)
Lymphs Abs: 2 10*3/uL (ref 0.7–4.0)
MCHC: 34.2 g/dL (ref 30.0–36.0)
MCV: 87.1 fl (ref 78.0–100.0)
Monocytes Absolute: 0.5 10*3/uL (ref 0.1–1.0)
Monocytes Relative: 8.3 % (ref 3.0–12.0)
Neutro Abs: 3.2 10*3/uL (ref 1.4–7.7)
Neutrophils Relative %: 54.6 % (ref 43.0–77.0)
Platelets: 274 10*3/uL (ref 150.0–400.0)
RBC: 4.8 Mil/uL (ref 3.87–5.11)
RDW: 13.5 % (ref 11.5–15.5)
WBC: 5.9 10*3/uL (ref 4.0–10.5)

## 2022-05-15 LAB — COMPREHENSIVE METABOLIC PANEL
ALT: 14 U/L (ref 0–35)
AST: 13 U/L (ref 0–37)
Albumin: 4.3 g/dL (ref 3.5–5.2)
Alkaline Phosphatase: 41 U/L (ref 39–117)
BUN: 11 mg/dL (ref 6–23)
CO2: 28 mEq/L (ref 19–32)
Calcium: 9.6 mg/dL (ref 8.4–10.5)
Chloride: 105 mEq/L (ref 96–112)
Creatinine, Ser: 0.8 mg/dL (ref 0.40–1.20)
GFR: 96.23 mL/min (ref >60.00)
Glucose, Bld: 105 mg/dL — ABNORMAL HIGH (ref 70–99)
Potassium: 4.6 mEq/L (ref 3.5–5.1)
Sodium: 139 mEq/L (ref 135–145)
Total Bilirubin: 0.7 mg/dL (ref 0.2–1.2)
Total Protein: 7 g/dL (ref 6.0–8.3)

## 2022-05-15 LAB — LIPID PANEL
Cholesterol: 137 mg/dL (ref 0–200)
HDL: 60.6 mg/dL (ref >39.00)
LDL Cholesterol: 64 mg/dL (ref 0–99)
NonHDL: 76.69
Total CHOL/HDL Ratio: 2
Triglycerides: 63 mg/dL (ref 0.0–149.0)
VLDL: 12.6 mg/dL (ref 0.0–40.0)

## 2022-05-15 LAB — HEMOGLOBIN A1C: Hgb A1c MFr Bld: 5.4 % (ref 4.6–6.5)

## 2022-05-15 NOTE — Progress Notes (Signed)
Established Patient Office Visit  Subjective   Patient ID: Brandi Russo, female    DOB: 1985/08/15  Age: 37 y.o. MRN: [id number]  CC: DM, BP f/u    HPI Patient here for follow-up. Reports she is doing really well overall. No new concerns.    DIABETES/OBESITY: - Checking BG at home: occasionally, fasting is usually 80-100, has had a few lows during the night - Medications: Ozempic 2 mg weekly (for the past few months), metformin 500 mg BID - Compliance: good - Diet: low carb diet  - Exercise: Peloton 3-4x/week - Microalbumin: June 2023 - Denies symptoms of hypoglycemia, polyuria, polydipsia, numbness extremities, foot ulcers/trauma, wounds that are not healing, medication side effects  - Starting to feel a big difference in how her clothes fit. She is feeling really well overall.   Lab Results  Component Value Date   HGBA1C 6.2 10/08/2021   Wt Readings from Last 3 Encounters:  05/15/22 173 lb (78.5 kg)  10/08/21 196 lb 9.6 oz (89.2 kg)  07/16/21 203 lb 3.2 oz (92.2 kg)            ROS All review of systems negative except what is listed in the HPI    Objective:     BP 125/76   Pulse 82   Temp 98 F (36.7 C)   Resp 16   Ht 5\' 4"  (1.626 m)   Wt 173 lb (78.5 kg)   SpO2 100%   BMI 29.70 kg/m    Physical Exam Vitals reviewed.  Constitutional:      General: She is not in acute distress.    Appearance: Normal appearance. She is obese. She is not ill-appearing.  Cardiovascular:     Rate and Rhythm: Normal rate and regular rhythm.  Pulmonary:     Effort: Pulmonary effort is normal.     Breath sounds: Normal breath sounds.  Musculoskeletal:     Cervical back: Normal range of motion and neck supple. No tenderness.     Right lower leg: No edema.     Left lower leg: No edema.  Lymphadenopathy:     Cervical: No cervical adenopathy.  Skin:    General: Skin is warm and dry.  Neurological:     General: No focal deficit present.     Mental Status: She is  alert and oriented to person, place, and time. Mental status is at baseline.  Psychiatric:        Mood and Affect: Mood normal.        Behavior: Behavior normal.        Thought Content: Thought content normal.        Judgment: Judgment normal.      No results found for any visits on 05/15/22.    The ASCVD Risk score (Arnett DK, et al., 2019) failed to calculate for the following reasons:   The 2019 ASCVD risk score is only valid for ages 79 to 59    Assessment & Plan:   1. Type 2 diabetes mellitus without complication, without long-term current use of insulin (HCC) Well controlled with last A1c 6.2% Continue current medications - metformin 500 mg BID and Ozempic 2 mg weekly For occasional low glucose during the night, recommend trying bedtime snack in the evening; if consistent and A1c is still good, we may have to reduce Ozempic or cut out the evening metformin dose.  Patient aware of signs/symptoms requiring further/urgent evaluation.  Discussed diet and exercise F/u in 3-6 months pending labs  Orders Placed This Encounter  Procedures   HgB A1c   CBC with Differential/Platelet   Comprehensive metabolic panel   Lipid panel      Return in about 6 months (around 11/13/2022) for routine follow-up.    Terrilyn Saver, NP

## 2022-05-15 NOTE — Assessment & Plan Note (Signed)
Well controlled with last A1c 6.2% Continue current medications - metformin 500 mg BID and Ozempic 2 mg weekly For occasional low glucose during the night, recommend trying bedtime snack in the evening; if consistent and A1c is still good, we may have to reduce Ozempic or cut out the evening metformin dose.  Patient aware of signs/symptoms requiring further/urgent evaluation.  Discussed diet and exercise

## 2022-05-15 NOTE — Patient Instructions (Signed)
Glad you are doing well! Let's update labs and then we can discuss any necessary changes.  Keep monitoring your glucose - if you continue have nighttime lows, start eating a balanced snack before bed, or we can consider stopping the bedtime metformin if A1c is looking good.

## 2022-05-16 NOTE — Progress Notes (Signed)
Labs look great! Your A1c has come down significantly! You're doing so well on the Ozempic, let's try coming off of the metformin because I don't want your blood sugars to start dropping too low. Keep monitoring fasting glucose levels for [place]. If still having lows, we may need to back down on the Ozempic a little bit. Follow-up in about 3 months or sooner if needed.

## 2022-05-23 ENCOUNTER — Other Ambulatory Visit: Payer: Self-pay

## 2022-05-23 ENCOUNTER — Other Ambulatory Visit: Payer: Self-pay | Admitting: Family Medicine

## 2022-05-23 ENCOUNTER — Other Ambulatory Visit (HOSPITAL_BASED_OUTPATIENT_CLINIC_OR_DEPARTMENT_OTHER): Payer: Self-pay

## 2022-05-23 DIAGNOSIS — E669 Obesity, unspecified: Secondary | ICD-10-CM

## 2022-05-23 DIAGNOSIS — E119 Type 2 diabetes mellitus without complications: Secondary | ICD-10-CM

## 2022-05-23 MED ORDER — OZEMPIC (2 MG/DOSE) 8 MG/3ML ~~LOC~~ SOPN
2.0000 mg | PEN_INJECTOR | SUBCUTANEOUS | 1 refills | Status: DC
  Filled 2022-05-23: qty 3, 28d supply, fill #0
  Filled 2022-06-18: qty 3, 28d supply, fill #1

## 2022-06-06 ENCOUNTER — Other Ambulatory Visit (HOSPITAL_BASED_OUTPATIENT_CLINIC_OR_DEPARTMENT_OTHER): Payer: Self-pay

## 2022-06-11 ENCOUNTER — Other Ambulatory Visit (HOSPITAL_BASED_OUTPATIENT_CLINIC_OR_DEPARTMENT_OTHER): Payer: Self-pay

## 2022-06-11 DIAGNOSIS — F909 Attention-deficit hyperactivity disorder, unspecified type: Secondary | ICD-10-CM | POA: Diagnosis not present

## 2022-06-11 MED ORDER — AMPHETAMINE-DEXTROAMPHET ER 30 MG PO CP24
30.0000 mg | ORAL_CAPSULE | Freq: Every morning | ORAL | 0 refills | Status: DC
  Filled 2022-08-15: qty 30, 30d supply, fill #0

## 2022-06-11 MED ORDER — AMPHETAMINE-DEXTROAMPHET ER 30 MG PO CP24
30.0000 mg | ORAL_CAPSULE | Freq: Every morning | ORAL | 0 refills | Status: DC
  Filled 2022-06-11: qty 30, 30d supply, fill #0

## 2022-06-11 MED ORDER — AMPHETAMINE-DEXTROAMPHETAMINE 20 MG PO TABS
20.0000 mg | ORAL_TABLET | Freq: Every day | ORAL | 0 refills | Status: DC
  Filled 2022-08-15: qty 30, 30d supply, fill #0

## 2022-06-11 MED ORDER — AMPHETAMINE-DEXTROAMPHETAMINE 20 MG PO TABS
20.0000 mg | ORAL_TABLET | Freq: Every day | ORAL | 0 refills | Status: DC
  Filled 2022-07-16: qty 30, 30d supply, fill #0

## 2022-06-11 MED ORDER — AMPHETAMINE-DEXTROAMPHET ER 30 MG PO CP24
30.0000 mg | ORAL_CAPSULE | Freq: Every morning | ORAL | 0 refills | Status: DC
  Filled 2022-07-16: qty 30, 30d supply, fill #0

## 2022-06-11 MED ORDER — AMPHETAMINE-DEXTROAMPHETAMINE 20 MG PO TABS
20.0000 mg | ORAL_TABLET | Freq: Every day | ORAL | 0 refills | Status: DC
  Filled 2022-06-11 – 2022-06-13 (×2): qty 30, 30d supply, fill #0

## 2022-06-12 ENCOUNTER — Other Ambulatory Visit (HOSPITAL_BASED_OUTPATIENT_CLINIC_OR_DEPARTMENT_OTHER): Payer: Self-pay

## 2022-06-13 ENCOUNTER — Other Ambulatory Visit (HOSPITAL_BASED_OUTPATIENT_CLINIC_OR_DEPARTMENT_OTHER): Payer: Self-pay

## 2022-06-18 ENCOUNTER — Other Ambulatory Visit (HOSPITAL_BASED_OUTPATIENT_CLINIC_OR_DEPARTMENT_OTHER): Payer: Self-pay

## 2022-07-02 ENCOUNTER — Other Ambulatory Visit (HOSPITAL_BASED_OUTPATIENT_CLINIC_OR_DEPARTMENT_OTHER): Payer: Self-pay

## 2022-07-02 ENCOUNTER — Encounter: Payer: Self-pay | Admitting: Family Medicine

## 2022-07-02 DIAGNOSIS — E119 Type 2 diabetes mellitus without complications: Secondary | ICD-10-CM

## 2022-07-02 DIAGNOSIS — E669 Obesity, unspecified: Secondary | ICD-10-CM

## 2022-07-02 MED ORDER — TIRZEPATIDE 2.5 MG/0.5ML ~~LOC~~ SOAJ
2.5000 mg | SUBCUTANEOUS | 1 refills | Status: DC
  Filled 2022-07-02: qty 2, 28d supply, fill #0

## 2022-07-13 ENCOUNTER — Other Ambulatory Visit (HOSPITAL_BASED_OUTPATIENT_CLINIC_OR_DEPARTMENT_OTHER): Payer: Self-pay

## 2022-07-16 ENCOUNTER — Other Ambulatory Visit (HOSPITAL_BASED_OUTPATIENT_CLINIC_OR_DEPARTMENT_OTHER): Payer: Self-pay

## 2022-07-27 ENCOUNTER — Encounter: Payer: Self-pay | Admitting: Family Medicine

## 2022-07-28 ENCOUNTER — Other Ambulatory Visit (HOSPITAL_BASED_OUTPATIENT_CLINIC_OR_DEPARTMENT_OTHER): Payer: Self-pay

## 2022-07-28 MED ORDER — TIRZEPATIDE 5 MG/0.5ML ~~LOC~~ SOAJ
5.0000 mg | SUBCUTANEOUS | 0 refills | Status: DC
  Filled 2022-07-28: qty 2, 28d supply, fill #0

## 2022-08-15 ENCOUNTER — Other Ambulatory Visit (HOSPITAL_BASED_OUTPATIENT_CLINIC_OR_DEPARTMENT_OTHER): Payer: Self-pay

## 2022-08-15 ENCOUNTER — Other Ambulatory Visit: Payer: Self-pay | Admitting: Family Medicine

## 2022-08-15 DIAGNOSIS — E119 Type 2 diabetes mellitus without complications: Secondary | ICD-10-CM

## 2022-08-15 MED ORDER — METFORMIN HCL 500 MG PO TABS
500.0000 mg | ORAL_TABLET | Freq: Two times a day (BID) | ORAL | 1 refills | Status: DC
  Filled 2022-08-15: qty 180, 90d supply, fill #0

## 2022-08-19 ENCOUNTER — Other Ambulatory Visit: Payer: Self-pay | Admitting: Family Medicine

## 2022-08-19 ENCOUNTER — Other Ambulatory Visit (HOSPITAL_BASED_OUTPATIENT_CLINIC_OR_DEPARTMENT_OTHER): Payer: Self-pay

## 2022-08-19 ENCOUNTER — Other Ambulatory Visit: Payer: Self-pay

## 2022-08-19 MED ORDER — MOUNJARO 5 MG/0.5ML ~~LOC~~ SOAJ
5.0000 mg | SUBCUTANEOUS | 0 refills | Status: DC
  Filled 2022-08-19 – 2022-08-22 (×2): qty 2, 28d supply, fill #0

## 2022-08-22 ENCOUNTER — Other Ambulatory Visit (HOSPITAL_BASED_OUTPATIENT_CLINIC_OR_DEPARTMENT_OTHER): Payer: Self-pay

## 2022-08-28 ENCOUNTER — Other Ambulatory Visit (HOSPITAL_BASED_OUTPATIENT_CLINIC_OR_DEPARTMENT_OTHER): Payer: Self-pay

## 2022-08-29 ENCOUNTER — Other Ambulatory Visit (HOSPITAL_BASED_OUTPATIENT_CLINIC_OR_DEPARTMENT_OTHER): Payer: Self-pay

## 2022-08-30 ENCOUNTER — Other Ambulatory Visit (HOSPITAL_BASED_OUTPATIENT_CLINIC_OR_DEPARTMENT_OTHER): Payer: Self-pay

## 2022-09-03 ENCOUNTER — Other Ambulatory Visit (HOSPITAL_BASED_OUTPATIENT_CLINIC_OR_DEPARTMENT_OTHER): Payer: Self-pay

## 2022-09-03 DIAGNOSIS — F909 Attention-deficit hyperactivity disorder, unspecified type: Secondary | ICD-10-CM | POA: Diagnosis not present

## 2022-09-03 MED ORDER — AMPHETAMINE-DEXTROAMPHET ER 30 MG PO CP24
30.0000 mg | ORAL_CAPSULE | Freq: Every morning | ORAL | 0 refills | Status: DC
  Filled 2022-11-21: qty 30, 30d supply, fill #0

## 2022-09-03 MED ORDER — AMPHETAMINE-DEXTROAMPHETAMINE 20 MG PO TABS
20.0000 mg | ORAL_TABLET | Freq: Every day | ORAL | 0 refills | Status: DC
  Filled 2022-09-16: qty 30, 30d supply, fill #0

## 2022-09-03 MED ORDER — AMPHETAMINE-DEXTROAMPHETAMINE 20 MG PO TABS
20.0000 mg | ORAL_TABLET | Freq: Every day | ORAL | 0 refills | Status: DC
  Filled 2022-10-22: qty 30, 30d supply, fill #0

## 2022-09-03 MED ORDER — AMPHETAMINE-DEXTROAMPHETAMINE 20 MG PO TABS
20.0000 mg | ORAL_TABLET | Freq: Every day | ORAL | 0 refills | Status: DC
  Filled 2022-11-21: qty 30, 30d supply, fill #0

## 2022-09-03 MED ORDER — AMPHETAMINE-DEXTROAMPHET ER 30 MG PO CP24
30.0000 mg | ORAL_CAPSULE | Freq: Every morning | ORAL | 0 refills | Status: DC
  Filled 2022-09-16: qty 30, 30d supply, fill #0

## 2022-09-03 MED ORDER — AMPHETAMINE-DEXTROAMPHET ER 30 MG PO CP24
30.0000 mg | ORAL_CAPSULE | Freq: Every morning | ORAL | 0 refills | Status: DC
  Filled 2022-10-22: qty 10, 10d supply, fill #0
  Filled 2022-10-22: qty 20, 20d supply, fill #0

## 2022-09-08 ENCOUNTER — Encounter: Payer: Self-pay | Admitting: Family Medicine

## 2022-09-08 DIAGNOSIS — E119 Type 2 diabetes mellitus without complications: Secondary | ICD-10-CM

## 2022-09-08 MED ORDER — TIRZEPATIDE 7.5 MG/0.5ML ~~LOC~~ SOAJ
7.5000 mg | SUBCUTANEOUS | 3 refills | Status: DC
Start: 2022-09-08 — End: 2023-01-29
  Filled 2022-09-08: qty 2, 28d supply, fill #0
  Filled 2022-10-06: qty 2, 28d supply, fill #1
  Filled 2022-11-05: qty 2, 28d supply, fill #2
  Filled 2022-12-11: qty 2, 28d supply, fill #3
  Filled 2023-01-07: qty 2, 28d supply, fill #4

## 2022-09-09 ENCOUNTER — Other Ambulatory Visit (HOSPITAL_BASED_OUTPATIENT_CLINIC_OR_DEPARTMENT_OTHER): Payer: Self-pay

## 2022-09-10 DIAGNOSIS — E119 Type 2 diabetes mellitus without complications: Secondary | ICD-10-CM | POA: Diagnosis not present

## 2022-09-10 LAB — HM DIABETES EYE EXAM

## 2022-09-11 ENCOUNTER — Other Ambulatory Visit (HOSPITAL_BASED_OUTPATIENT_CLINIC_OR_DEPARTMENT_OTHER): Payer: Self-pay

## 2022-09-16 ENCOUNTER — Other Ambulatory Visit (HOSPITAL_BASED_OUTPATIENT_CLINIC_OR_DEPARTMENT_OTHER): Payer: Self-pay

## 2022-10-15 ENCOUNTER — Other Ambulatory Visit (HOSPITAL_BASED_OUTPATIENT_CLINIC_OR_DEPARTMENT_OTHER): Payer: Self-pay

## 2022-10-17 ENCOUNTER — Other Ambulatory Visit (HOSPITAL_BASED_OUTPATIENT_CLINIC_OR_DEPARTMENT_OTHER): Payer: Self-pay

## 2022-10-22 ENCOUNTER — Other Ambulatory Visit (HOSPITAL_BASED_OUTPATIENT_CLINIC_OR_DEPARTMENT_OTHER): Payer: Self-pay

## 2022-10-22 ENCOUNTER — Other Ambulatory Visit: Payer: Self-pay

## 2022-11-21 ENCOUNTER — Other Ambulatory Visit (HOSPITAL_BASED_OUTPATIENT_CLINIC_OR_DEPARTMENT_OTHER): Payer: Self-pay

## 2022-11-26 ENCOUNTER — Other Ambulatory Visit (HOSPITAL_BASED_OUTPATIENT_CLINIC_OR_DEPARTMENT_OTHER): Payer: Self-pay

## 2022-11-26 DIAGNOSIS — F909 Attention-deficit hyperactivity disorder, unspecified type: Secondary | ICD-10-CM | POA: Diagnosis not present

## 2022-11-26 MED ORDER — AMPHETAMINE-DEXTROAMPHET ER 30 MG PO CP24
30.0000 mg | ORAL_CAPSULE | Freq: Every morning | ORAL | 0 refills | Status: DC
  Filled 2023-01-23: qty 30, 30d supply, fill #0

## 2022-11-26 MED ORDER — AMPHETAMINE-DEXTROAMPHETAMINE 20 MG PO TABS
20.0000 mg | ORAL_TABLET | Freq: Every day | ORAL | 0 refills | Status: DC
  Filled 2023-01-23: qty 30, 30d supply, fill #0

## 2022-11-26 MED ORDER — AMPHETAMINE-DEXTROAMPHET ER 30 MG PO CP24
30.0000 mg | ORAL_CAPSULE | Freq: Every morning | ORAL | 0 refills | Status: DC
  Filled 2022-12-17 – 2022-12-23 (×2): qty 30, 30d supply, fill #0

## 2022-11-26 MED ORDER — AMPHETAMINE-DEXTROAMPHETAMINE 20 MG PO TABS
20.0000 mg | ORAL_TABLET | Freq: Every day | ORAL | 0 refills | Status: DC
  Filled 2023-04-27: qty 30, 30d supply, fill #0

## 2022-11-26 MED ORDER — AMPHETAMINE-DEXTROAMPHET ER 30 MG PO CP24
30.0000 mg | ORAL_CAPSULE | Freq: Every morning | ORAL | 0 refills | Status: DC
  Filled 2023-04-27: qty 30, 30d supply, fill #0

## 2022-11-26 MED ORDER — AMPHETAMINE-DEXTROAMPHETAMINE 20 MG PO TABS
20.0000 mg | ORAL_TABLET | Freq: Every day | ORAL | 0 refills | Status: DC
  Filled 2022-12-17 – 2022-12-23 (×2): qty 30, 30d supply, fill #0

## 2022-12-17 ENCOUNTER — Other Ambulatory Visit (HOSPITAL_BASED_OUTPATIENT_CLINIC_OR_DEPARTMENT_OTHER): Payer: Self-pay

## 2022-12-23 ENCOUNTER — Other Ambulatory Visit (HOSPITAL_BASED_OUTPATIENT_CLINIC_OR_DEPARTMENT_OTHER): Payer: Self-pay

## 2023-01-23 ENCOUNTER — Other Ambulatory Visit (HOSPITAL_BASED_OUTPATIENT_CLINIC_OR_DEPARTMENT_OTHER): Payer: Self-pay

## 2023-01-29 ENCOUNTER — Ambulatory Visit: Payer: BC Managed Care – PPO | Admitting: Family Medicine

## 2023-01-29 ENCOUNTER — Other Ambulatory Visit (HOSPITAL_BASED_OUTPATIENT_CLINIC_OR_DEPARTMENT_OTHER): Payer: Self-pay

## 2023-01-29 VITALS — BP 110/72 | HR 76 | Ht 64.0 in | Wt 165.0 lb

## 2023-01-29 DIAGNOSIS — Z7984 Long term (current) use of oral hypoglycemic drugs: Secondary | ICD-10-CM | POA: Diagnosis not present

## 2023-01-29 DIAGNOSIS — E119 Type 2 diabetes mellitus without complications: Secondary | ICD-10-CM | POA: Diagnosis not present

## 2023-01-29 DIAGNOSIS — Z7985 Long-term (current) use of injectable non-insulin antidiabetic drugs: Secondary | ICD-10-CM

## 2023-01-29 LAB — COMPREHENSIVE METABOLIC PANEL
ALT: 17 U/L (ref 0–35)
AST: 17 U/L (ref 0–37)
Albumin: 4.3 g/dL (ref 3.5–5.2)
Alkaline Phosphatase: 40 U/L (ref 39–117)
BUN: 12 mg/dL (ref 6–23)
CO2: 25 mEq/L (ref 19–32)
Calcium: 9.5 mg/dL (ref 8.4–10.5)
Chloride: 106 mEq/L (ref 96–112)
Creatinine, Ser: 0.81 mg/dL (ref 0.40–1.20)
GFR: 94.34 mL/min (ref >60.00)
Glucose, Bld: 90 mg/dL (ref 70–99)
Potassium: 4.2 mEq/L (ref 3.5–5.1)
Sodium: 140 mEq/L (ref 135–145)
Total Bilirubin: 0.6 mg/dL (ref 0.2–1.2)
Total Protein: 7.2 g/dL (ref 6.0–8.3)

## 2023-01-29 LAB — MICROALBUMIN / CREATININE URINE RATIO
Creatinine,U: 149.3 mg/dL
Microalb Creat Ratio: 1.2 mg/g (ref 0.0–30.0)
Microalb, Ur: 1.8 mg/dL (ref 0.0–1.9)

## 2023-01-29 LAB — HEMOGLOBIN A1C: Hgb A1c MFr Bld: 5.1 % (ref 4.6–6.5)

## 2023-01-29 MED ORDER — METFORMIN HCL 500 MG PO TABS
500.0000 mg | ORAL_TABLET | Freq: Two times a day (BID) | ORAL | 1 refills | Status: AC
Start: 2023-01-29 — End: ?
  Filled 2023-01-29: qty 180, 90d supply, fill #0

## 2023-01-29 MED ORDER — TIRZEPATIDE 10 MG/0.5ML ~~LOC~~ SOAJ
10.0000 mg | SUBCUTANEOUS | 1 refills | Status: DC
Start: 2023-01-29 — End: 2023-08-04
  Filled 2023-01-29: qty 2, 28d supply, fill #0
  Filled 2023-03-03: qty 2, 28d supply, fill #1
  Filled 2023-04-01: qty 2, 28d supply, fill #2
  Filled 2023-04-27: qty 2, 28d supply, fill #3
  Filled 2023-05-27: qty 2, 28d supply, fill #4
  Filled 2023-06-24: qty 2, 28d supply, fill #5

## 2023-01-29 NOTE — Progress Notes (Signed)
Established Patient Office Visit  Subjective   Patient ID: Brandi Russo, female    DOB: 02-03-1986  Age: 37 y.o. MRN: [id number]  CC: DM, BP f/u    Diabetes   Patient here for follow-up. Reports she is doing really well overall. No new concerns.    DIABETES/OBESITY: - Checking BG at home: rarely - Medications: Mounjaro 7.5 mg/daily, metformin 500 mg BID (not taking every day due to some low glucose readings in the past) - Compliance: good - Diet: low carb diet  - Exercise: Peloton 3-4x/week - Microalbumin: June 2023 - Denies symptoms of hypoglycemia, polyuria, polydipsia, numbness extremities, foot ulcers/trauma, wounds that are not healing, medication side effects  - She is feeling really well overall.   Lab Results  Component Value Date   HGBA1C 5.4 05/15/2022   Wt Readings from Last 3 Encounters:  01/29/23 165 lb (74.8 kg)  05/15/22 173 lb (78.5 kg)  10/08/21 196 lb 9.6 oz (89.2 kg)      ROS All review of systems negative except what is listed in the HPI    Objective:     BP 110/72   Pulse 76   Ht 5\' 4"  (1.626 m)   Wt 165 lb (74.8 kg)   SpO2 100%   BMI 28.32 kg/m    Physical Exam Vitals reviewed.  Constitutional:      General: She is not in acute distress.    Appearance: Normal appearance. She is obese. She is not ill-appearing.  Cardiovascular:     Rate and Rhythm: Normal rate and regular rhythm.  Pulmonary:     Effort: Pulmonary effort is normal.     Breath sounds: Normal breath sounds.  Musculoskeletal:     Cervical back: Normal range of motion and neck supple. No tenderness.     Right lower leg: No edema.     Left lower leg: No edema.  Lymphadenopathy:     Cervical: No cervical adenopathy.  Skin:    General: Skin is warm and dry.  Neurological:     General: No focal deficit present.     Mental Status: She is alert and oriented to person, place, and time. Mental status is at baseline.  Psychiatric:        Mood and Affect: Mood normal.         Behavior: Behavior normal.        Thought Content: Thought content normal.        Judgment: Judgment normal.      No results found for any visits on 01/29/23.    The ASCVD Risk score (Arnett DK, et al., 2019) failed to calculate for the following reasons:   The 2019 ASCVD risk score is only valid for ages 49 to 86    Assessment & Plan:   Problem List Items Addressed This Visit       Active Problems   DM2 (diabetes mellitus, type 2) (HCC) - Primary (Chronic)    Well controlled with last A1c 5.4% Continue current medications - metformin 500 mg BID and increase to Mounjaro 10 mg/weekly Patient aware of signs/symptoms requiring further/urgent evaluation.  Discussed diet and exercise       Relevant Medications   metFORMIN (GLUCOPHAGE) 500 MG tablet   tirzepatide (MOUNJARO) 10 MG/0.5ML Pen   Other Relevant Orders   Microalbumin / creatinine urine ratio   Hemoglobin A1c   Comprehensive metabolic panel      Orders Placed This Encounter  Procedures   Microalbumin / creatinine urine  ratio   Hemoglobin A1c   Comprehensive metabolic panel      Return in about 6 months (around 07/29/2023) for routine follow-up.    Clayborne Dana, NP

## 2023-01-29 NOTE — Assessment & Plan Note (Signed)
Well controlled with last A1c 5.4% Continue current medications - metformin 500 mg BID and increase to Mounjaro 10 mg/weekly Patient aware of signs/symptoms requiring further/urgent evaluation.  Discussed diet and exercise

## 2023-02-18 ENCOUNTER — Other Ambulatory Visit (HOSPITAL_BASED_OUTPATIENT_CLINIC_OR_DEPARTMENT_OTHER): Payer: Self-pay

## 2023-02-18 DIAGNOSIS — F909 Attention-deficit hyperactivity disorder, unspecified type: Secondary | ICD-10-CM | POA: Diagnosis not present

## 2023-02-18 MED ORDER — AMPHETAMINE-DEXTROAMPHETAMINE 20 MG PO TABS
20.0000 mg | ORAL_TABLET | Freq: Every day | ORAL | 0 refills | Status: DC
Start: 2023-02-18 — End: 2023-09-16
  Filled 2023-02-20: qty 30, 30d supply, fill #0

## 2023-02-18 MED ORDER — AMPHETAMINE-DEXTROAMPHET ER 30 MG PO CP24
30.0000 mg | ORAL_CAPSULE | Freq: Every morning | ORAL | 0 refills | Status: DC
  Filled 2023-03-25 – 2023-06-30 (×2): qty 30, 30d supply, fill #0

## 2023-02-18 MED ORDER — AMPHETAMINE-DEXTROAMPHETAMINE 20 MG PO TABS
20.0000 mg | ORAL_TABLET | Freq: Every day | ORAL | 0 refills | Status: DC
  Filled 2023-03-25: qty 30, 30d supply, fill #0

## 2023-02-18 MED ORDER — AMPHETAMINE-DEXTROAMPHET ER 30 MG PO CP24
30.0000 mg | ORAL_CAPSULE | Freq: Every morning | ORAL | 0 refills | Status: DC
  Filled 2023-02-20: qty 30, 30d supply, fill #0

## 2023-02-18 MED ORDER — AMPHETAMINE-DEXTROAMPHETAMINE 20 MG PO TABS
20.0000 mg | ORAL_TABLET | Freq: Every day | ORAL | 0 refills | Status: DC
  Filled 2023-02-20 – 2023-06-30 (×2): qty 30, 30d supply, fill #0

## 2023-02-18 MED ORDER — AMPHETAMINE-DEXTROAMPHET ER 30 MG PO CP24
30.0000 mg | ORAL_CAPSULE | Freq: Every morning | ORAL | 0 refills | Status: DC
Start: 2023-02-18 — End: 2023-09-16
  Filled 2023-03-25: qty 30, 30d supply, fill #0

## 2023-02-19 ENCOUNTER — Other Ambulatory Visit (HOSPITAL_BASED_OUTPATIENT_CLINIC_OR_DEPARTMENT_OTHER): Payer: Self-pay

## 2023-02-20 ENCOUNTER — Other Ambulatory Visit: Payer: Self-pay

## 2023-02-20 ENCOUNTER — Other Ambulatory Visit (HOSPITAL_BASED_OUTPATIENT_CLINIC_OR_DEPARTMENT_OTHER): Payer: Self-pay

## 2023-03-25 ENCOUNTER — Other Ambulatory Visit (HOSPITAL_BASED_OUTPATIENT_CLINIC_OR_DEPARTMENT_OTHER): Payer: Self-pay

## 2023-04-27 ENCOUNTER — Other Ambulatory Visit (HOSPITAL_BASED_OUTPATIENT_CLINIC_OR_DEPARTMENT_OTHER): Payer: Self-pay

## 2023-05-20 ENCOUNTER — Other Ambulatory Visit (HOSPITAL_BASED_OUTPATIENT_CLINIC_OR_DEPARTMENT_OTHER): Payer: Self-pay

## 2023-05-20 DIAGNOSIS — F909 Attention-deficit hyperactivity disorder, unspecified type: Secondary | ICD-10-CM | POA: Diagnosis not present

## 2023-05-20 MED ORDER — AMPHETAMINE-DEXTROAMPHETAMINE 20 MG PO TABS
20.0000 mg | ORAL_TABLET | Freq: Every day | ORAL | 0 refills | Status: DC
Start: 2023-05-20 — End: 2023-09-16
  Filled 2023-05-22 – 2023-05-25 (×2): qty 30, 30d supply, fill #0

## 2023-05-20 MED ORDER — AMPHETAMINE-DEXTROAMPHET ER 30 MG PO CP24
30.0000 mg | ORAL_CAPSULE | Freq: Every morning | ORAL | 0 refills | Status: DC
Start: 2023-05-20 — End: 2023-09-16
  Filled 2023-05-22 – 2023-05-25 (×2): qty 30, 30d supply, fill #0

## 2023-05-20 MED ORDER — AMPHETAMINE-DEXTROAMPHETAMINE 20 MG PO TABS
20.0000 mg | ORAL_TABLET | Freq: Every day | ORAL | 0 refills | Status: DC
  Filled 2023-09-03: qty 30, 30d supply, fill #0

## 2023-05-20 MED ORDER — AMPHETAMINE-DEXTROAMPHET ER 30 MG PO CP24
30.0000 mg | ORAL_CAPSULE | Freq: Every morning | ORAL | 0 refills | Status: DC
  Filled 2023-08-04: qty 30, 30d supply, fill #0

## 2023-05-20 MED ORDER — AMPHETAMINE-DEXTROAMPHETAMINE 20 MG PO TABS
20.0000 mg | ORAL_TABLET | Freq: Every day | ORAL | 0 refills | Status: DC
  Filled 2023-08-04: qty 30, 30d supply, fill #0

## 2023-05-20 MED ORDER — AMPHETAMINE-DEXTROAMPHET ER 30 MG PO CP24
30.0000 mg | ORAL_CAPSULE | Freq: Every morning | ORAL | 0 refills | Status: DC
  Filled 2023-09-03: qty 30, 30d supply, fill #0

## 2023-05-22 ENCOUNTER — Other Ambulatory Visit (HOSPITAL_BASED_OUTPATIENT_CLINIC_OR_DEPARTMENT_OTHER): Payer: Self-pay

## 2023-05-25 ENCOUNTER — Other Ambulatory Visit (HOSPITAL_BASED_OUTPATIENT_CLINIC_OR_DEPARTMENT_OTHER): Payer: Self-pay

## 2023-06-30 ENCOUNTER — Other Ambulatory Visit (HOSPITAL_BASED_OUTPATIENT_CLINIC_OR_DEPARTMENT_OTHER): Payer: Self-pay

## 2023-08-04 ENCOUNTER — Other Ambulatory Visit: Payer: Self-pay | Admitting: Family Medicine

## 2023-08-04 ENCOUNTER — Other Ambulatory Visit (HOSPITAL_BASED_OUTPATIENT_CLINIC_OR_DEPARTMENT_OTHER): Payer: Self-pay

## 2023-08-04 DIAGNOSIS — E119 Type 2 diabetes mellitus without complications: Secondary | ICD-10-CM

## 2023-08-04 MED ORDER — MOUNJARO 10 MG/0.5ML ~~LOC~~ SOAJ
10.0000 mg | SUBCUTANEOUS | 0 refills | Status: DC
  Filled 2023-08-04: qty 2, 28d supply, fill #0

## 2023-08-12 ENCOUNTER — Other Ambulatory Visit (HOSPITAL_BASED_OUTPATIENT_CLINIC_OR_DEPARTMENT_OTHER): Payer: Self-pay

## 2023-08-12 DIAGNOSIS — F909 Attention-deficit hyperactivity disorder, unspecified type: Secondary | ICD-10-CM | POA: Diagnosis not present

## 2023-08-12 MED ORDER — AMPHETAMINE-DEXTROAMPHETAMINE 20 MG PO TABS
20.0000 mg | ORAL_TABLET | Freq: Every day | ORAL | 0 refills | Status: AC
  Filled 2023-11-17 (×2): qty 30, 30d supply, fill #0

## 2023-08-12 MED ORDER — AMPHETAMINE-DEXTROAMPHET ER 30 MG PO CP24
30.0000 mg | ORAL_CAPSULE | Freq: Every morning | ORAL | 0 refills | Status: AC
  Filled 2023-11-14: qty 30, 30d supply, fill #0

## 2023-08-12 MED ORDER — AMPHETAMINE-DEXTROAMPHET ER 30 MG PO CP24
30.0000 mg | ORAL_CAPSULE | Freq: Every morning | ORAL | 0 refills | Status: DC
Start: 2023-08-12 — End: 2024-02-04
  Filled 2023-12-21 – 2023-12-28 (×2): qty 30, 30d supply, fill #0

## 2023-08-12 MED ORDER — AMPHETAMINE-DEXTROAMPHETAMINE 20 MG PO TABS
20.0000 mg | ORAL_TABLET | Freq: Every day | ORAL | 0 refills | Status: AC
  Filled 2023-10-06: qty 30, 30d supply, fill #0

## 2023-08-12 MED ORDER — AMPHETAMINE-DEXTROAMPHETAMINE 20 MG PO TABS
20.0000 mg | ORAL_TABLET | Freq: Every day | ORAL | 0 refills | Status: DC
Start: 2023-08-12 — End: 2024-02-04
  Filled 2023-12-21 – 2023-12-28 (×2): qty 30, 30d supply, fill #0

## 2023-08-12 MED ORDER — AMPHETAMINE-DEXTROAMPHET ER 30 MG PO CP24
30.0000 mg | ORAL_CAPSULE | Freq: Every morning | ORAL | 0 refills | Status: AC
Start: 2023-08-12 — End: ?
  Filled 2023-10-06: qty 30, 30d supply, fill #0

## 2023-09-03 ENCOUNTER — Other Ambulatory Visit: Payer: Self-pay | Admitting: Family Medicine

## 2023-09-03 ENCOUNTER — Other Ambulatory Visit (HOSPITAL_BASED_OUTPATIENT_CLINIC_OR_DEPARTMENT_OTHER): Payer: Self-pay

## 2023-09-03 DIAGNOSIS — E119 Type 2 diabetes mellitus without complications: Secondary | ICD-10-CM

## 2023-09-11 ENCOUNTER — Other Ambulatory Visit: Payer: Self-pay | Admitting: Family Medicine

## 2023-09-11 ENCOUNTER — Other Ambulatory Visit (HOSPITAL_BASED_OUTPATIENT_CLINIC_OR_DEPARTMENT_OTHER): Payer: Self-pay

## 2023-09-11 DIAGNOSIS — E119 Type 2 diabetes mellitus without complications: Secondary | ICD-10-CM

## 2023-09-16 ENCOUNTER — Other Ambulatory Visit (HOSPITAL_BASED_OUTPATIENT_CLINIC_OR_DEPARTMENT_OTHER): Payer: Self-pay

## 2023-09-16 ENCOUNTER — Ambulatory Visit: Payer: Self-pay | Admitting: Family Medicine

## 2023-09-16 ENCOUNTER — Ambulatory Visit: Admitting: Family Medicine

## 2023-09-16 ENCOUNTER — Encounter: Payer: Self-pay | Admitting: Family Medicine

## 2023-09-16 VITALS — BP 125/80 | HR 95 | Ht 64.0 in | Wt 170.0 lb

## 2023-09-16 DIAGNOSIS — Z7985 Long-term (current) use of injectable non-insulin antidiabetic drugs: Secondary | ICD-10-CM | POA: Diagnosis not present

## 2023-09-16 DIAGNOSIS — E119 Type 2 diabetes mellitus without complications: Secondary | ICD-10-CM

## 2023-09-16 DIAGNOSIS — Z7984 Long term (current) use of oral hypoglycemic drugs: Secondary | ICD-10-CM | POA: Diagnosis not present

## 2023-09-16 LAB — CBC WITH DIFFERENTIAL/PLATELET
Basophils Absolute: 0 10*3/uL (ref 0.0–0.1)
Basophils Relative: 0.4 % (ref 0.0–3.0)
Eosinophils Absolute: 0.1 10*3/uL (ref 0.0–0.7)
Eosinophils Relative: 2.1 % (ref 0.0–5.0)
HCT: 41.8 % (ref 36.0–46.0)
Hemoglobin: 14.2 g/dL (ref 12.0–15.0)
Lymphocytes Relative: 33.1 % (ref 12.0–46.0)
Lymphs Abs: 2 10*3/uL (ref 0.7–4.0)
MCHC: 33.9 g/dL (ref 30.0–36.0)
MCV: 87.6 fl (ref 78.0–100.0)
Monocytes Absolute: 0.4 10*3/uL (ref 0.1–1.0)
Monocytes Relative: 6.4 % (ref 3.0–12.0)
Neutro Abs: 3.5 10*3/uL (ref 1.4–7.7)
Neutrophils Relative %: 58 % (ref 43.0–77.0)
Platelets: 286 10*3/uL (ref 150.0–400.0)
RBC: 4.77 Mil/uL (ref 3.87–5.11)
RDW: 12.8 % (ref 11.5–15.5)
WBC: 6.1 10*3/uL (ref 4.0–10.5)

## 2023-09-16 LAB — LIPID PANEL
Cholesterol: 157 mg/dL (ref 0–200)
HDL: 56 mg/dL (ref >39.00)
LDL Cholesterol: 76 mg/dL (ref 0–99)
NonHDL: 100.78
Total CHOL/HDL Ratio: 3
Triglycerides: 123 mg/dL (ref 0.0–149.0)
VLDL: 24.6 mg/dL (ref 0.0–40.0)

## 2023-09-16 LAB — COMPREHENSIVE METABOLIC PANEL WITH GFR
ALT: 14 U/L (ref 0–35)
AST: 16 U/L (ref 0–37)
Albumin: 4.3 g/dL (ref 3.5–5.2)
Alkaline Phosphatase: 45 U/L (ref 39–117)
BUN: 13 mg/dL (ref 6–23)
CO2: 26 meq/L (ref 19–32)
Calcium: 9 mg/dL (ref 8.4–10.5)
Chloride: 105 meq/L (ref 96–112)
Creatinine, Ser: 0.69 mg/dL (ref 0.40–1.20)
GFR: 112.29 mL/min (ref >60.00)
Glucose, Bld: 105 mg/dL — ABNORMAL HIGH (ref 70–99)
Potassium: 4.6 meq/L (ref 3.5–5.1)
Sodium: 139 meq/L (ref 135–145)
Total Bilirubin: 0.4 mg/dL (ref 0.2–1.2)
Total Protein: 6.9 g/dL (ref 6.0–8.3)

## 2023-09-16 LAB — HEMOGLOBIN A1C: Hgb A1c MFr Bld: 5.2 % (ref 4.6–6.5)

## 2023-09-16 LAB — TSH: TSH: 0.95 u[IU]/mL (ref 0.35–5.50)

## 2023-09-16 MED ORDER — MOUNJARO 10 MG/0.5ML ~~LOC~~ SOAJ
10.0000 mg | SUBCUTANEOUS | 1 refills | Status: DC
  Filled 2023-09-16: qty 2, 28d supply, fill #0
  Filled 2023-10-14: qty 2, 28d supply, fill #1
  Filled 2023-11-14: qty 2, 28d supply, fill #2
  Filled 2023-11-16: qty 6, 84d supply, fill #2
  Filled 2024-02-02: qty 2, 28d supply, fill #3

## 2023-09-16 NOTE — Progress Notes (Signed)
 Established Patient Office Visit  Subjective   Patient ID: Brandi Russo, female    DOB: October 20, 1985  Age: 38 y.o. MRN: [id number]  CC: DM, BP f/u     Patient here for follow-up. Reports she is doing really well overall. No new concerns.    DIABETES/OBESITY: - Checking BG at home: rarely - Medications: Mounjaro  10 mg/weekly, metformin  500 mg BID (not using metformin  regularly, watches diet) - Compliance: good - Diet: low carb diet  - Exercise: walking, stationary bike - Microalbumin: September 2024, normal - Denies symptoms of hypoglycemia, polyuria, polydipsia, numbness extremities, foot ulcers/trauma, wounds that are not healing, medication side effects  - She is feeling really well overall.   Lab Results  Component Value Date   HGBA1C 5.1 01/29/2023   Wt Readings from Last 3 Encounters:  09/16/23 170 lb (77.1 kg)  01/29/23 165 lb (74.8 kg)  05/15/22 173 lb (78.5 kg)     ADHD - managed with Adderall  30 mg XR in the morning and occasional 20 mg in the afternoon by psych NP       ROS All review of systems negative except what is listed in the HPI    Objective:     BP 125/80   Pulse 95   Ht 5\' 4"  (1.626 m)   Wt 170 lb (77.1 kg)   SpO2 99%   BMI 29.18 kg/m    Physical Exam Vitals reviewed.  Constitutional:      General: She is not in acute distress.    Appearance: Normal appearance. She is obese. She is not ill-appearing.  Cardiovascular:     Rate and Rhythm: Normal rate and regular rhythm.  Pulmonary:     Effort: Pulmonary effort is normal.     Breath sounds: Normal breath sounds.  Musculoskeletal:     Cervical back: Normal range of motion and neck supple. No tenderness.     Right lower leg: No edema.     Left lower leg: No edema.  Lymphadenopathy:     Cervical: No cervical adenopathy.  Skin:    General: Skin is warm and dry.  Neurological:     General: No focal deficit present.     Mental Status: She is alert and oriented to person, place,  and time. Mental status is at baseline.  Psychiatric:        Mood and Affect: Mood normal.        Behavior: Behavior normal.        Thought Content: Thought content normal.        Judgment: Judgment normal.      No results found for any visits on 09/16/23.    The ASCVD Risk score (Arnett DK, et al., 2019) failed to calculate for the following reasons:   The 2019 ASCVD risk score is only valid for ages 48 to 39    Assessment & Plan:   Problem List Items Addressed This Visit       Active Problems   DM2 (diabetes mellitus, type 2) (HCC) - Primary (Chronic)   Well controlled with last A1c 5.1% Continue current medications - metformin  500 mg BID and Mounjaro  10 mg/weekly Patient aware of signs/symptoms requiring further/urgent evaluation.  Discussed diet and exercise Labs today       Relevant Medications   tirzepatide  (MOUNJARO ) 10 MG/0.5ML Pen   Other Relevant Orders   Hemoglobin A1c   CBC with Differential/Platelet   Comprehensive metabolic panel with GFR   Lipid panel   TSH  Orders Placed This Encounter  Procedures   Hemoglobin A1c   CBC with Differential/Platelet   Comprehensive metabolic panel with GFR   Lipid panel   TSH      Return in about 6 months (around 03/18/2024) for physical.    Everlina Hock, NP

## 2023-09-16 NOTE — Assessment & Plan Note (Signed)
 Well controlled with last A1c 5.1% Continue current medications - metformin  500 mg BID and Mounjaro  10 mg/weekly Patient aware of signs/symptoms requiring further/urgent evaluation.  Discussed diet and exercise Labs today

## 2023-10-06 ENCOUNTER — Other Ambulatory Visit (HOSPITAL_BASED_OUTPATIENT_CLINIC_OR_DEPARTMENT_OTHER): Payer: Self-pay

## 2023-11-14 ENCOUNTER — Other Ambulatory Visit (HOSPITAL_BASED_OUTPATIENT_CLINIC_OR_DEPARTMENT_OTHER): Payer: Self-pay

## 2023-11-16 ENCOUNTER — Other Ambulatory Visit (HOSPITAL_BASED_OUTPATIENT_CLINIC_OR_DEPARTMENT_OTHER): Payer: Self-pay

## 2023-11-16 DIAGNOSIS — F909 Attention-deficit hyperactivity disorder, unspecified type: Secondary | ICD-10-CM | POA: Diagnosis not present

## 2023-11-17 ENCOUNTER — Other Ambulatory Visit: Payer: Self-pay

## 2023-11-17 ENCOUNTER — Other Ambulatory Visit (HOSPITAL_BASED_OUTPATIENT_CLINIC_OR_DEPARTMENT_OTHER): Payer: Self-pay

## 2023-12-21 ENCOUNTER — Other Ambulatory Visit (HOSPITAL_BASED_OUTPATIENT_CLINIC_OR_DEPARTMENT_OTHER): Payer: Self-pay

## 2023-12-28 ENCOUNTER — Other Ambulatory Visit (HOSPITAL_BASED_OUTPATIENT_CLINIC_OR_DEPARTMENT_OTHER): Payer: Self-pay

## 2024-01-05 ENCOUNTER — Encounter: Payer: Self-pay | Admitting: Neurology

## 2024-01-05 DIAGNOSIS — E119 Type 2 diabetes mellitus without complications: Secondary | ICD-10-CM | POA: Diagnosis not present

## 2024-01-05 LAB — HM DIABETES EYE EXAM

## 2024-01-21 ENCOUNTER — Other Ambulatory Visit (HOSPITAL_BASED_OUTPATIENT_CLINIC_OR_DEPARTMENT_OTHER): Payer: Self-pay

## 2024-02-02 ENCOUNTER — Other Ambulatory Visit (HOSPITAL_BASED_OUTPATIENT_CLINIC_OR_DEPARTMENT_OTHER): Payer: Self-pay

## 2024-02-04 ENCOUNTER — Encounter (HOSPITAL_BASED_OUTPATIENT_CLINIC_OR_DEPARTMENT_OTHER): Payer: Self-pay

## 2024-02-04 ENCOUNTER — Other Ambulatory Visit (HOSPITAL_BASED_OUTPATIENT_CLINIC_OR_DEPARTMENT_OTHER): Payer: Self-pay

## 2024-02-04 ENCOUNTER — Other Ambulatory Visit: Payer: Self-pay | Admitting: Family Medicine

## 2024-02-04 DIAGNOSIS — G4700 Insomnia, unspecified: Secondary | ICD-10-CM | POA: Diagnosis not present

## 2024-02-04 DIAGNOSIS — F900 Attention-deficit hyperactivity disorder, predominantly inattentive type: Secondary | ICD-10-CM | POA: Diagnosis not present

## 2024-02-04 DIAGNOSIS — E119 Type 2 diabetes mellitus without complications: Secondary | ICD-10-CM

## 2024-02-04 MED ORDER — MOUNJARO 10 MG/0.5ML ~~LOC~~ SOAJ
10.0000 mg | SUBCUTANEOUS | 2 refills | Status: AC
  Filled 2024-02-04: qty 6, 84d supply, fill #0
  Filled 2024-04-27: qty 6, 84d supply, fill #1

## 2024-02-04 MED ORDER — AMPHETAMINE-DEXTROAMPHET ER 30 MG PO CP24
30.0000 mg | ORAL_CAPSULE | Freq: Every morning | ORAL | 0 refills | Status: AC
  Filled 2024-03-13: qty 30, 30d supply, fill #0

## 2024-02-04 MED ORDER — AMPHETAMINE-DEXTROAMPHET ER 30 MG PO CP24
30.0000 mg | ORAL_CAPSULE | Freq: Every morning | ORAL | 0 refills | Status: AC
  Filled 2024-04-19: qty 30, 30d supply, fill #0

## 2024-02-04 MED ORDER — MOUNJARO 10 MG/0.5ML ~~LOC~~ SOAJ: 10.0000 mg | SUBCUTANEOUS | 0 refills | Status: DC

## 2024-02-04 MED ORDER — AMPHETAMINE-DEXTROAMPHETAMINE 20 MG PO TABS
20.0000 mg | ORAL_TABLET | Freq: Every day | ORAL | 0 refills | Status: AC
  Filled 2024-04-19: qty 30, 30d supply, fill #0

## 2024-02-04 MED ORDER — AMPHETAMINE-DEXTROAMPHET ER 30 MG PO CP24
30.0000 mg | ORAL_CAPSULE | Freq: Every morning | ORAL | 0 refills | Status: AC
  Filled 2024-02-04: qty 30, 30d supply, fill #0

## 2024-02-04 MED ORDER — AMPHETAMINE-DEXTROAMPHETAMINE 20 MG PO TABS
20.0000 mg | ORAL_TABLET | Freq: Every day | ORAL | 0 refills | Status: AC
  Filled 2024-03-13: qty 30, 30d supply, fill #0

## 2024-02-04 MED ORDER — AMPHETAMINE-DEXTROAMPHETAMINE 20 MG PO TABS
20.0000 mg | ORAL_TABLET | Freq: Every day | ORAL | 0 refills | Status: AC
  Filled 2024-02-04: qty 30, 30d supply, fill #0

## 2024-03-14 ENCOUNTER — Other Ambulatory Visit (HOSPITAL_BASED_OUTPATIENT_CLINIC_OR_DEPARTMENT_OTHER): Payer: Self-pay

## 2024-04-19 ENCOUNTER — Other Ambulatory Visit (HOSPITAL_BASED_OUTPATIENT_CLINIC_OR_DEPARTMENT_OTHER): Payer: Self-pay

## 2024-04-20 ENCOUNTER — Other Ambulatory Visit (HOSPITAL_BASED_OUTPATIENT_CLINIC_OR_DEPARTMENT_OTHER): Payer: Self-pay

## 2024-04-20 DIAGNOSIS — G4700 Insomnia, unspecified: Secondary | ICD-10-CM | POA: Diagnosis not present

## 2024-04-20 DIAGNOSIS — F900 Attention-deficit hyperactivity disorder, predominantly inattentive type: Secondary | ICD-10-CM | POA: Diagnosis not present

## 2024-04-20 DIAGNOSIS — F329 Major depressive disorder, single episode, unspecified: Secondary | ICD-10-CM | POA: Diagnosis not present

## 2024-04-20 MED ORDER — AMPHETAMINE-DEXTROAMPHETAMINE 20 MG PO TABS
20.0000 mg | ORAL_TABLET | Freq: Every day | ORAL | 0 refills | Status: AC
  Filled 2024-04-20: qty 30, 30d supply, fill #0

## 2024-04-20 MED ORDER — SERTRALINE HCL 25 MG PO TABS
ORAL_TABLET | ORAL | 0 refills | Status: AC
  Filled 2024-04-20: qty 21, 14d supply, fill #0

## 2024-04-20 MED ORDER — AMPHETAMINE-DEXTROAMPHET ER 30 MG PO CP24: 30.0000 mg | ORAL_CAPSULE | Freq: Every morning | ORAL | 0 refills | Status: AC

## 2024-04-20 MED ORDER — AMPHETAMINE-DEXTROAMPHET ER 30 MG PO CP24
30.0000 mg | ORAL_CAPSULE | Freq: Every morning | ORAL | 0 refills | Status: AC
  Filled 2024-04-20: qty 30, 30d supply, fill #0

## 2024-05-06 ENCOUNTER — Other Ambulatory Visit (HOSPITAL_BASED_OUTPATIENT_CLINIC_OR_DEPARTMENT_OTHER): Payer: Self-pay

## 2024-05-09 ENCOUNTER — Other Ambulatory Visit (HOSPITAL_BASED_OUTPATIENT_CLINIC_OR_DEPARTMENT_OTHER): Payer: Self-pay

## 2024-05-09 MED ORDER — SERTRALINE HCL 50 MG PO TABS
50.0000 mg | ORAL_TABLET | Freq: Every day | ORAL | 1 refills | Status: AC
  Filled 2024-05-09: qty 30, 30d supply, fill #0
  Filled 2024-05-26 – 2024-06-01 (×3): qty 30, 30d supply, fill #1

## 2024-05-18 ENCOUNTER — Other Ambulatory Visit: Payer: Self-pay

## 2024-05-18 ENCOUNTER — Other Ambulatory Visit (HOSPITAL_BASED_OUTPATIENT_CLINIC_OR_DEPARTMENT_OTHER): Payer: Self-pay

## 2024-05-18 MED ORDER — AMPHETAMINE-DEXTROAMPHETAMINE 20 MG PO TABS
20.0000 mg | ORAL_TABLET | Freq: Every day | ORAL | 0 refills | Status: AC
  Filled 2024-05-18: qty 30, 30d supply, fill #0

## 2024-05-18 MED ORDER — AMPHETAMINE-DEXTROAMPHET ER 30 MG PO CP24
30.0000 mg | ORAL_CAPSULE | Freq: Every morning | ORAL | 0 refills | Status: AC
  Filled 2024-05-18: qty 30, 30d supply, fill #0

## 2024-05-18 MED ORDER — AMPHETAMINE-DEXTROAMPHET ER 30 MG PO CP24
30.0000 mg | ORAL_CAPSULE | Freq: Every morning | ORAL | 0 refills | Status: AC
  Filled 2024-05-18 – 2024-06-01 (×2): qty 30, 30d supply, fill #0

## 2024-05-26 ENCOUNTER — Other Ambulatory Visit (HOSPITAL_BASED_OUTPATIENT_CLINIC_OR_DEPARTMENT_OTHER): Payer: Self-pay

## 2024-06-01 ENCOUNTER — Other Ambulatory Visit: Payer: Self-pay

## 2024-06-01 ENCOUNTER — Other Ambulatory Visit (HOSPITAL_BASED_OUTPATIENT_CLINIC_OR_DEPARTMENT_OTHER): Payer: Self-pay
